# Patient Record
Sex: Male | Born: 1970 | Race: White | Hispanic: No | Marital: Single | Smoking: Current every day smoker
Health system: Southern US, Community
[De-identification: ages and names within clinical notes are randomized; demographics above are authoritative.]

## PROBLEM LIST (undated history)

## (undated) HISTORY — PX: HERNIA REPAIR: SHX51

---

## 1999-09-19 ENCOUNTER — Emergency Department (HOSPITAL_COMMUNITY): Admission: EM | Admit: 1999-09-19 | Discharge: 1999-09-19 | Payer: Self-pay | Admitting: Emergency Medicine

## 1999-12-12 ENCOUNTER — Emergency Department (HOSPITAL_COMMUNITY): Admission: EM | Admit: 1999-12-12 | Discharge: 1999-12-12 | Payer: Self-pay | Admitting: Emergency Medicine

## 2006-05-19 ENCOUNTER — Ambulatory Visit (HOSPITAL_COMMUNITY): Admission: RE | Admit: 2006-05-19 | Discharge: 2006-05-19 | Payer: Self-pay | Admitting: *Deleted

## 2009-08-12 ENCOUNTER — Inpatient Hospital Stay (HOSPITAL_COMMUNITY): Admission: EM | Admit: 2009-08-12 | Discharge: 2009-08-13 | Payer: Self-pay | Admitting: Emergency Medicine

## 2009-08-13 ENCOUNTER — Encounter (INDEPENDENT_AMBULATORY_CARE_PROVIDER_SITE_OTHER): Payer: Self-pay | Admitting: Surgery

## 2010-05-31 LAB — URINALYSIS, ROUTINE W REFLEX MICROSCOPIC
Bilirubin Urine: NEGATIVE
Glucose, UA: NEGATIVE mg/dL
Hgb urine dipstick: NEGATIVE
Ketones, ur: NEGATIVE mg/dL
Nitrite: NEGATIVE
Protein, ur: NEGATIVE mg/dL
Specific Gravity, Urine: 1.005 (ref 1.005–1.030)
Urobilinogen, UA: 0.2 mg/dL (ref 0.0–1.0)
pH: 6.5 (ref 5.0–8.0)

## 2010-05-31 LAB — DIFFERENTIAL
Basophils Absolute: 0 10*3/uL (ref 0.0–0.1)
Basophils Relative: 0 % (ref 0–1)
Eosinophils Absolute: 0.1 10*3/uL (ref 0.0–0.7)
Eosinophils Relative: 1 % (ref 0–5)
Lymphocytes Relative: 11 % — ABNORMAL LOW (ref 12–46)
Lymphs Abs: 1.4 10*3/uL (ref 0.7–4.0)
Monocytes Absolute: 0.5 10*3/uL (ref 0.1–1.0)
Monocytes Relative: 5 % (ref 3–12)
Neutro Abs: 10.2 10*3/uL — ABNORMAL HIGH (ref 1.7–7.7)
Neutrophils Relative %: 83 % — ABNORMAL HIGH (ref 43–77)

## 2010-05-31 LAB — BASIC METABOLIC PANEL
BUN: 5 mg/dL — ABNORMAL LOW (ref 6–23)
CO2: 26 mEq/L (ref 19–32)
Calcium: 8.4 mg/dL (ref 8.4–10.5)
Chloride: 100 mEq/L (ref 96–112)
Creatinine, Ser: 0.97 mg/dL (ref 0.4–1.5)
GFR calc Af Amer: >60 mL/min (ref >60)
GFR calc non Af Amer: >60 mL/min (ref >60)
Glucose, Bld: 110 mg/dL — ABNORMAL HIGH (ref 70–99)
Potassium: 3.8 mEq/L (ref 3.5–5.1)
Sodium: 130 mEq/L — ABNORMAL LOW (ref 135–145)

## 2010-05-31 LAB — CBC
HCT: 44.9 % (ref 39.0–52.0)
Hemoglobin: 15.8 g/dL (ref 13.0–17.0)
MCHC: 35.1 g/dL (ref 30.0–36.0)
MCV: 91.2 fL (ref 78.0–100.0)
Platelets: 200 10*3/uL (ref 150–400)
RBC: 4.93 MIL/uL (ref 4.22–5.81)
RDW: 13 % (ref 11.5–15.5)
WBC: 12.3 10*3/uL — ABNORMAL HIGH (ref 4.0–10.5)

## 2010-07-30 NOTE — Op Note (Signed)
John Pearson, John Pearson             ACCOUNT NO.:  [id number]   MEDICAL RECORD NO.:  [id number]          PATIENT TYPE:  AMB   LOCATION:  DAY                          FACILITY:  [facility]   PHYSICIAN:  Alfonse Ras, MD   DATE OF BIRTH:  February 08, 1971   DATE OF PROCEDURE:  05/19/2006  DATE OF DISCHARGE:                               OPERATIVE REPORT   PREOPERATIVE DIAGNOSIS:  Bilateral inguinal hernias.   POSTOPERATIVE DIAGNOSIS:  Bilateral inguinal hernias, indirect.   PROCEDURE:  Bilateral inguinal hernia repair left side with mesh.   SURGEON:  Dr. Baruch Merl.   ANESTHESIA:  General.   DESCRIPTION OF PROCEDURE:  The patient was taken to the operating room,  placed in a supine position and after adequate general anesthesia was  induced using endotracheal tube the abdomen and groins were prepped and  draped in the normal sterile fashion. Using an oblique incision over the  right inguinal region, I dissected down the external oblique fascia  which was opened along its fibers.  This was taken down to the external  ring.  The spermatic cord was surrounded with a Penrose drain at the  external ring.  Indirect hernia sac was identified and dissected off the  spermatic cord.  This was ligated high up at the internal ring.  A  couple of reinforcing sutures were then approximated between the  inguinal ligament and transversalis fascia up near the internal ring.  There was no evidence of direct inguinal hernia.  The external oblique  fascia was then closed with a running 3-0 Vicryl and skin was closed  with staples.   I then turned my attention to the left side. An oblique incision was  made there. Again using Bovie electrocautery, I dissected down onto the  external oblique fascia.  This was opened along its fibers down to the  external ring.  Again the spermatic cord was identified and surrounded  with the Penrose drain.  Again an indirect hernia sac was dissected off  the spermatic cord  and ligated at the internal ring.  There was a little  bit more of a weakness in the floor of Hesselbach's triangle on this  side and therefore I approximated the floor using interrupted #0  Surgilon sutures and then reinforced it with Ethicon made mesh.  This  was reinforced using a 2-0 Prolene suture, split and brought out lateral  to the internal ring. The external oblique fascia was closed with a  running 3-0 Vicryl suture.  The skin was closed with staples.  Sterile  sterile dressings were applied.  The patient was taken to the recovery  room in good condition.      Alfonse Ras, MD  Electronically Signed     KRE/MEDQ  D:  05/19/2006  T:  05/19/2006  Job:  [id number]

## 2010-10-08 IMAGING — CT CT ABD-PELV W/ CM
2 of 4 series · 17 of 46 positions shown, 19 images · IV contrast (APPLIED)
Comparison: None.

Addendum Begins

This addendum is given for the purpose of correcting the first
conclusion in the initially dictated report.  Reference to the
Littre hernia is incorrect.  The right name for a hernia containing
the appendix is an Amyand's hernia.
Addendum Ends
CLINICAL DATA: Right groin pain.
CT ABDOMEN AND PELVIS WITH CONTRAST
TECHNIQUE: Multidetector CT imaging of the abdomen and pelvis was
performed following the standard protocol during bolus
administration of intravenous contrast.
Contrast: 80 ml 5mnipaque-ZTT.

[Series 2: abd/pelv with 5.0 b31f st · axial · 0.69mm/px · z∈[+605,+1025]mm · 14 of 93 slices shown, 16 images]
[im 5/93  soft-tissue]
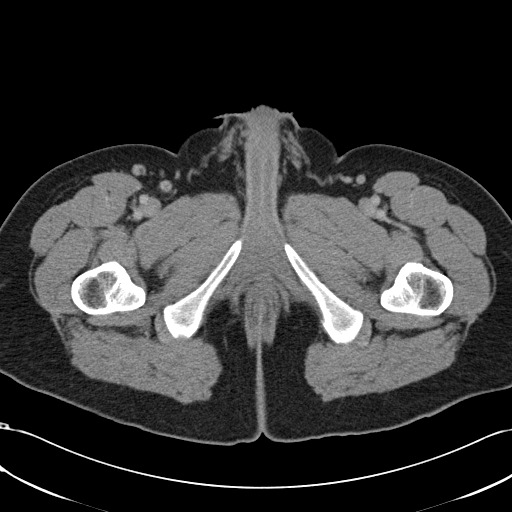
[im 5/93  bone]
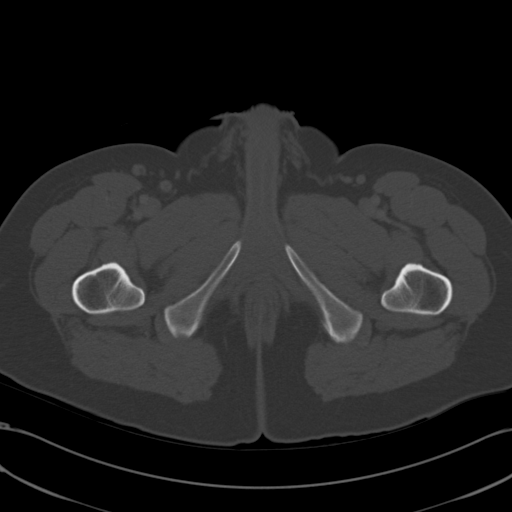
[im 13/93  soft-tissue]
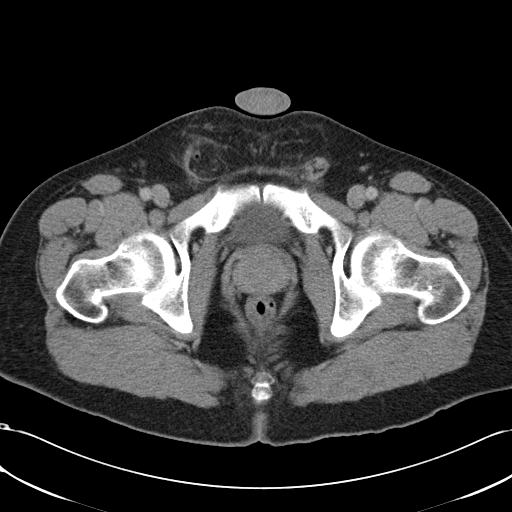
[im 17/93  soft-tissue]
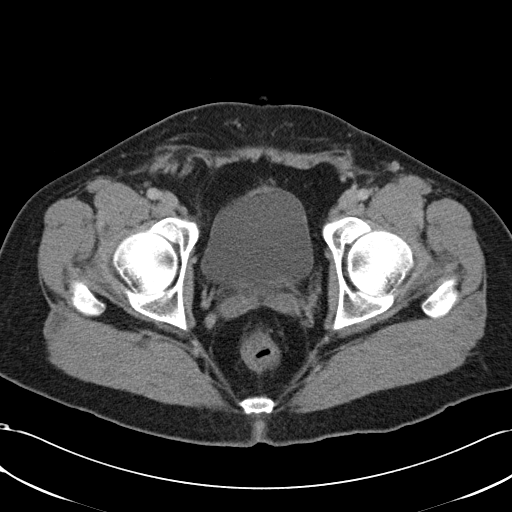
[im 25/93  soft-tissue]
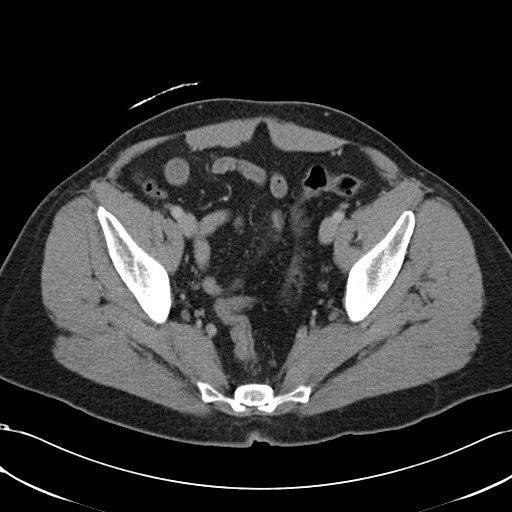
[im 33/93  soft-tissue]
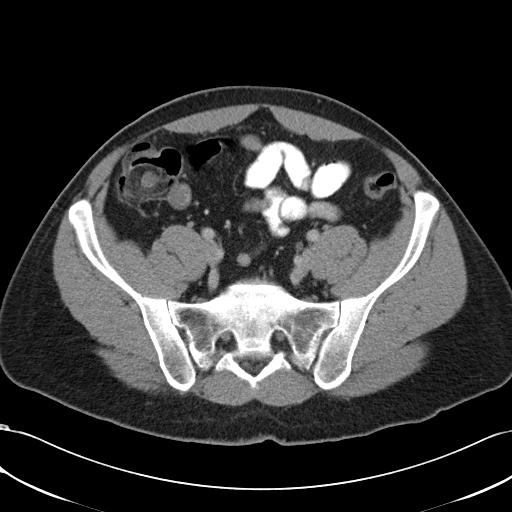
[im 37/93  soft-tissue]
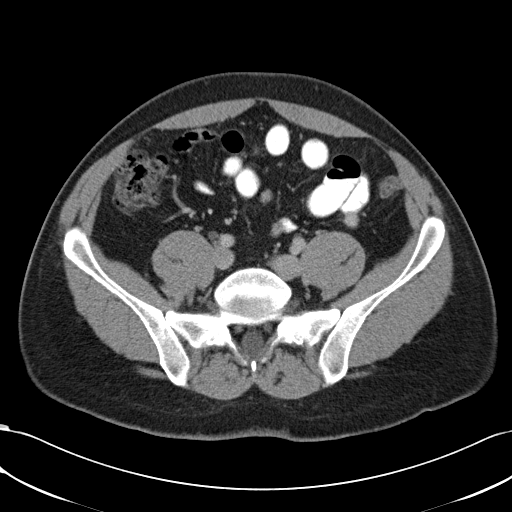
[im 45/93  soft-tissue]
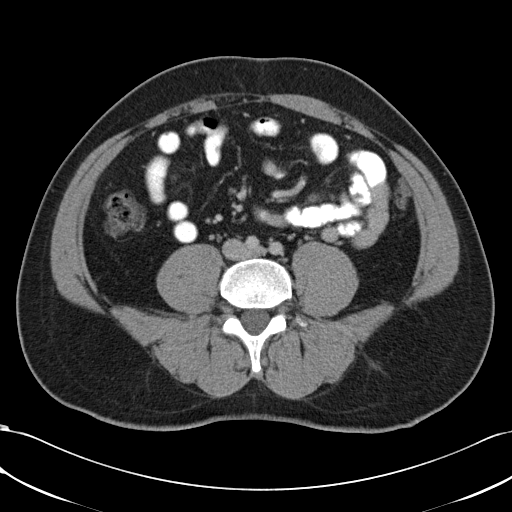
[im 49/93  soft-tissue]
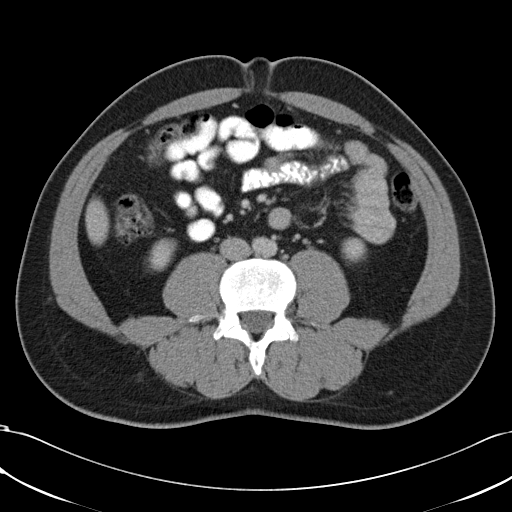
[im 57/93  soft-tissue]
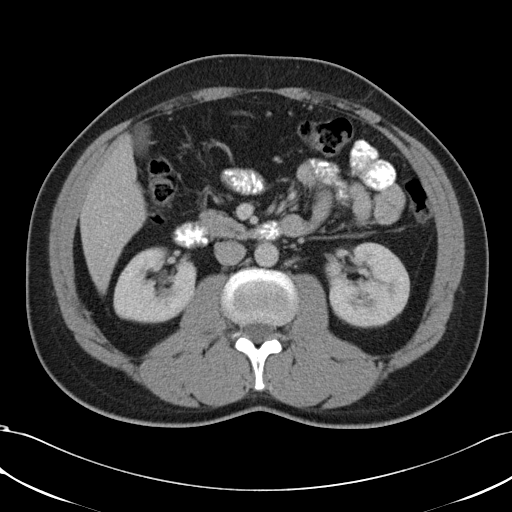
[im 57/93  bone]
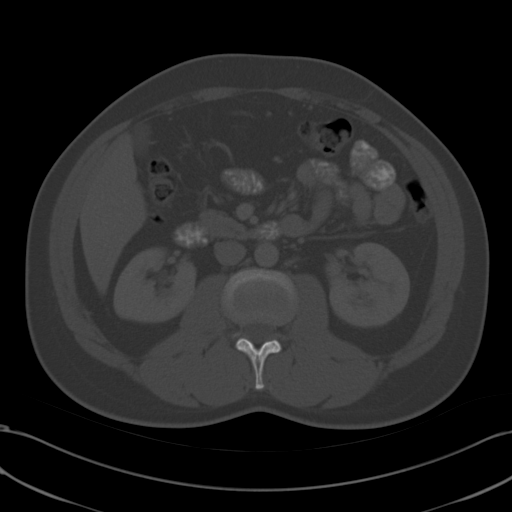
[im 61/93  soft-tissue]
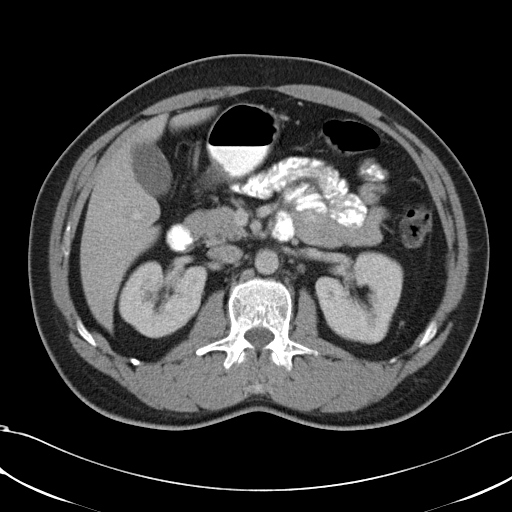
[im 69/93  soft-tissue]
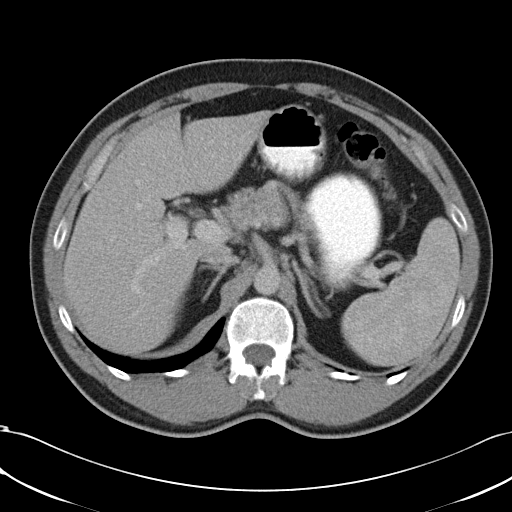
[im 77/93  soft-tissue]
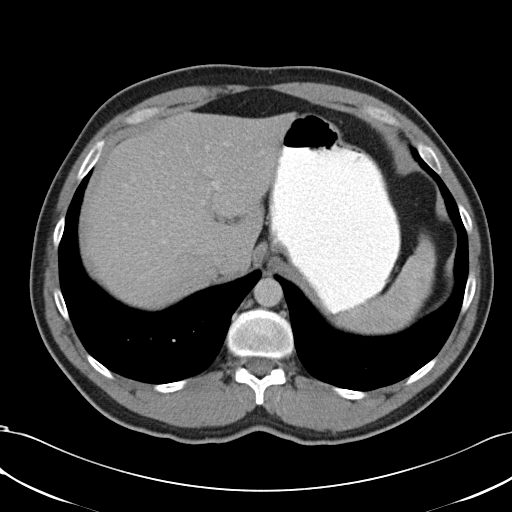
[im 81/93  soft-tissue]
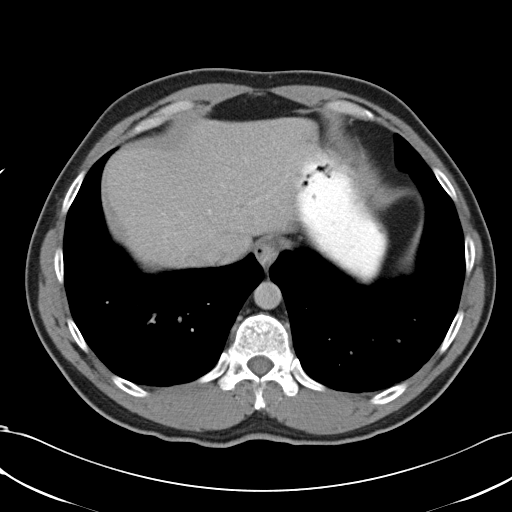
[im 89/93  soft-tissue]
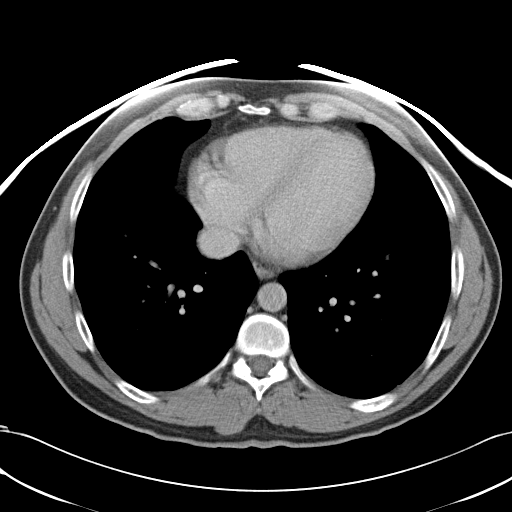

[Series 5: abd/pelv with 2.0 spo st · coronal · 0.91mm/px · 3 of 125 slices shown]
[im 42/125  soft-tissue]
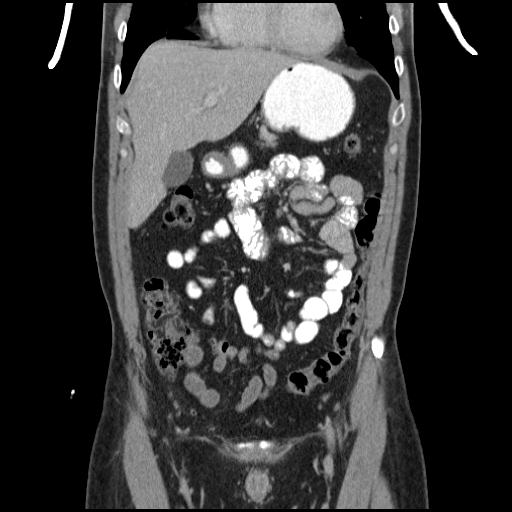
[im 56/125  soft-tissue]
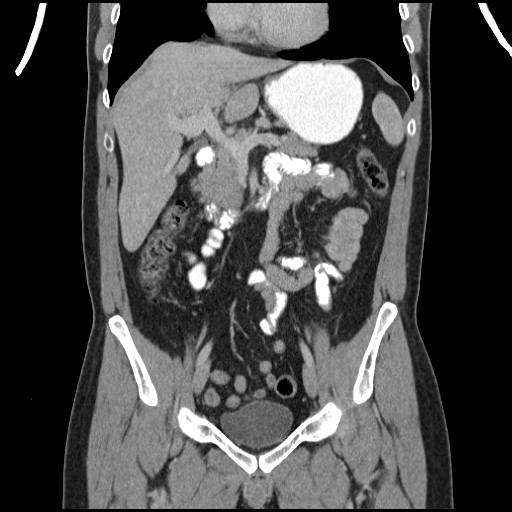
[im 69/125  soft-tissue]
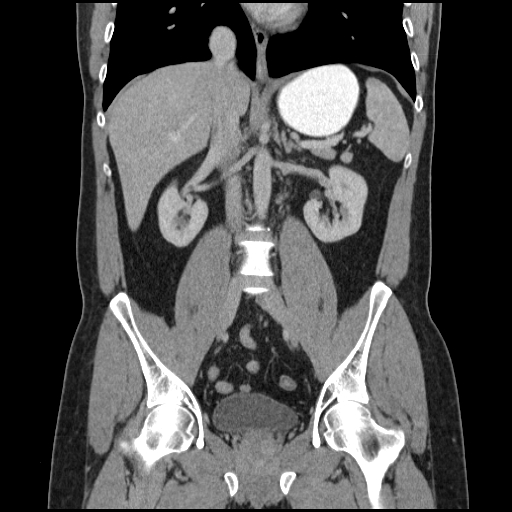

[17 of 46 positions shown; findings below may reference images not displayed]

FINDINGS: Lung bases are clear.  No pleural or pericardial
effusion.

The patient has a right inguinal hernia which contains the
appendix.  There is some infiltration of fat about the appendix but
the appendix itself is not dilated and contains gas distally.

There is a small amount of perihepatic fluid.  A 0.5 cm low
attenuating lesion in the inferior right hepatic lobe is likely a
cyst.  The liver is otherwise unremarkable.  The gallbladder,
adrenal glands, spleen, pancreas and kidneys appear normal.
Stomach and small bowel are unremarkable.  The patient has a fat
containing umbilical hernia.  There is no focal bony abnormality.
IMPRESSION: 1. Right inguinal hernia containing the appendix consistent with a
Littre hernia.  Although the appearance the appendix is not typical
of appendicitis, there is some stranding of fat within the hernia
which may be due to early appendicitis or possibly incarceration of
the appendix within the hernia.
2.  Small amount of perihepatic fluid.
3.  Fat containing umbilical hernia.

## 2012-10-27 ENCOUNTER — Emergency Department (HOSPITAL_COMMUNITY)
Admission: EM | Admit: 2012-10-27 | Discharge: 2012-10-27 | Disposition: A | Discharge status: Home / Self Care | Payer: Self-pay | Attending: Emergency Medicine | Admitting: Emergency Medicine

## 2012-10-27 ENCOUNTER — Encounter (HOSPITAL_COMMUNITY): Payer: Self-pay | Admitting: Emergency Medicine

## 2012-10-27 DIAGNOSIS — R109 Unspecified abdominal pain: Secondary | ICD-10-CM | POA: Insufficient documentation

## 2012-10-27 DIAGNOSIS — Y929 Unspecified place or not applicable: Secondary | ICD-10-CM | POA: Insufficient documentation

## 2012-10-27 DIAGNOSIS — K089 Disorder of teeth and supporting structures, unspecified: Secondary | ICD-10-CM | POA: Insufficient documentation

## 2012-10-27 DIAGNOSIS — R51 Headache: Secondary | ICD-10-CM | POA: Insufficient documentation

## 2012-10-27 DIAGNOSIS — K4090 Unilateral inguinal hernia, without obstruction or gangrene, not specified as recurrent: Secondary | ICD-10-CM | POA: Insufficient documentation

## 2012-10-27 DIAGNOSIS — Y939 Activity, unspecified: Secondary | ICD-10-CM | POA: Insufficient documentation

## 2012-10-27 DIAGNOSIS — G8929 Other chronic pain: Secondary | ICD-10-CM | POA: Insufficient documentation

## 2012-10-27 DIAGNOSIS — Z79899 Other long term (current) drug therapy: Secondary | ICD-10-CM | POA: Insufficient documentation

## 2012-10-27 DIAGNOSIS — X58XXXA Exposure to other specified factors, initial encounter: Secondary | ICD-10-CM | POA: Insufficient documentation

## 2012-10-27 DIAGNOSIS — F17200 Nicotine dependence, unspecified, uncomplicated: Secondary | ICD-10-CM | POA: Insufficient documentation

## 2012-10-27 DIAGNOSIS — K029 Dental caries, unspecified: Secondary | ICD-10-CM | POA: Insufficient documentation

## 2012-10-27 DIAGNOSIS — K0889 Other specified disorders of teeth and supporting structures: Secondary | ICD-10-CM

## 2012-10-27 MED ORDER — PENICILLIN V POTASSIUM 500 MG PO TABS: 500.0000 mg | ORAL_TABLET | Freq: Four times a day (QID) | ORAL | Status: AC

## 2012-10-27 MED ORDER — HYDROCODONE-ACETAMINOPHEN 5-325 MG PO TABS
2.0000 | ORAL_TABLET | Freq: Once | ORAL | Status: AC
  Administered 2012-10-27: 2 via ORAL
  Filled 2012-10-27: qty 2

## 2012-10-27 MED ORDER — PROMETHAZINE HCL 25 MG PO TABS: 25.0000 mg | ORAL_TABLET | Freq: Four times a day (QID) | ORAL | Status: DC | PRN

## 2012-10-27 MED ORDER — HYDROCODONE-ACETAMINOPHEN 5-325 MG PO TABS: 1.0000 | ORAL_TABLET | ORAL | Status: DC | PRN

## 2012-10-27 NOTE — ED Provider Notes (Signed)
CSN: [id number]     Arrival date & time 10/27/12  0145 History     First MD Initiated Contact with Patient 10/27/12 [phone]     Chief Complaint  Patient presents with  . Facial Pain  . Dental Problem  . Groin Injury   HPI  History provided by the patient. The patient is a 42 year old male with history of bilateral inguinal hernia repairs who presents with 2 complaints of continued dental pains as well as chronic pain to his right groin and inguinal area. Patient admits to very poor dentition. He occasionally has dental pains and aches, however over the past several days he complains of worsened pain or around the left upper molar areas. Patient has been taking ibuprofen and Orajel which has helped some with symptoms. He feels there is some increased swelling around his cheek and face area. The pain radiates into the head and ear area. He denies any difficulty swallowing or breathing. Denies any fever, chills or sweats. Patient also complains that he has chronic pains with right inguinal area with bulging hernia. He reports having this repaired twice in the past. He has had daily discomfort for the past year or more to this area. He states when he stands and does any lifting there is a large bulge to his groin area. He denies any constant or severe pains. No diarrhea or constipation symptoms. No other aggravating or alleviating factors. No other associated symptoms.    History reviewed. No pertinent past medical history. Past Surgical History  Procedure Laterality Date  . Hernia repair     No family history on file. History  Substance Use Topics  . Smoking status: Current Every Day Smoker  . Smokeless tobacco: Not on file  . Alcohol Use: No    Review of Systems  Constitutional: Negative for fever, chills and diaphoresis.  HENT: Positive for dental problem. Negative for sore throat and trouble swallowing.   Gastrointestinal: Negative for nausea, vomiting, diarrhea and constipation.  All  other systems reviewed and are negative.    Allergies  Review of patient's allergies indicates no known allergies.  Home Medications   Current Outpatient Rx  Name  Route  Sig  Dispense  Refill  . acetaminophen (TYLENOL) 500 MG tablet   Oral   Take 500 mg by mouth every 6 (six) hours as needed for pain.         Marland Kitchen HYDROcodone-acetaminophen (NORCO) 5-325 MG per tablet   Oral   Take 1 tablet by mouth every 4 (four) hours as needed for pain.   20 tablet   0   . penicillin v potassium (VEETID) 500 MG tablet   Oral   Take 1 tablet (500 mg total) by mouth 4 (four) times daily.   40 tablet   0   . promethazine (PHENERGAN) 25 MG tablet   Oral   Take 1 tablet (25 mg total) by mouth every 6 (six) hours as needed for nausea.   20 tablet   0    BP 147/86  Pulse 47  Temp(Src) 98.8 F (37.1 C) (Oral)  Resp 16  Ht 5\' 10"  (1.778 m)  Wt 189 lb (85.73 kg)  BMI 27.12 kg/m2  SpO2 97% Physical Exam  Nursing note and vitals reviewed. Constitutional: He is oriented to person, place, and time. He appears well-developed and well-nourished.  HENT:  Head: Normocephalic.  Significant dental decay throughout. Multiple teeth eroded to the gumline. There is some swelling around the remaining teeth of the  left upper molar area. No bleeding from the gums. No fluctuance or signs of drainable abscess. No swelling of the tongue.  Cardiovascular: Normal rate and regular rhythm.   Pulmonary/Chest: Effort normal and breath sounds normal.  Abdominal: Soft. There is no tenderness. There is no rebound and no guarding.  Genitourinary:  Moderate size right inguinal hernia is reducible. There is mild tenderness.  Musculoskeletal: Normal range of motion.  Neurological: He is alert and oriented to person, place, and time.  Skin: Skin is warm.  Psychiatric: He has a normal mood and affect. His behavior is normal.    ED Course   Procedures   1. Pain, dental   2. Inguinal hernia     MDM  5:45AM  patient seen and evaluated. Patient appears in some discomfort but no acute distress.  Patient has a reducible right inguinal hernia. No concerning signs of incarceration at this time. Mild tenderness to palpation. Will encourage him to followup with Gen. surgery. Patient also given dental referral for his dental pains.  Angus Seller, PA-C 10/27/12 2357

## 2012-10-27 NOTE — ED Notes (Signed)
Pt c/o facial pain, pt states he has lost several teeth lately. Pt also c/o R groin pain from hernia, pt states he has had surgery x 2 on same hernia

## 2012-10-28 NOTE — ED Provider Notes (Signed)
Medical screening examination/treatment/procedure(s) were performed by non-physician practitioner and as supervising physician I was immediately available for consultation/collaboration.  Katisha Shimizu M Rilea Arutyunyan, MD 10/28/12 0607 

## 2013-02-21 ENCOUNTER — Emergency Department (HOSPITAL_COMMUNITY): Payer: Self-pay

## 2013-02-21 ENCOUNTER — Encounter (HOSPITAL_COMMUNITY): Payer: Self-pay | Admitting: Emergency Medicine

## 2013-02-21 ENCOUNTER — Emergency Department (HOSPITAL_COMMUNITY)
Admission: EM | Admit: 2013-02-21 | Discharge: 2013-02-21 | Disposition: A | Discharge status: Home / Self Care | Payer: Self-pay | Attending: Emergency Medicine | Admitting: Emergency Medicine

## 2013-02-21 DIAGNOSIS — F17200 Nicotine dependence, unspecified, uncomplicated: Secondary | ICD-10-CM | POA: Insufficient documentation

## 2013-02-21 DIAGNOSIS — R141 Gas pain: Secondary | ICD-10-CM | POA: Insufficient documentation

## 2013-02-21 DIAGNOSIS — R142 Eructation: Secondary | ICD-10-CM | POA: Insufficient documentation

## 2013-02-21 DIAGNOSIS — K5900 Constipation, unspecified: Secondary | ICD-10-CM | POA: Insufficient documentation

## 2013-02-21 LAB — CBC WITH DIFFERENTIAL/PLATELET
Basophils Relative: 0 % (ref 0–1)
Lymphocytes Relative: 23 % (ref 12–46)
Lymphs Abs: 2.7 10*3/uL (ref 0.7–4.0)
MCH: 31.8 pg (ref 26.0–34.0)
Monocytes Relative: 6 % (ref 3–12)
RBC: 5.32 MIL/uL (ref 4.22–5.81)
WBC: 11.9 10*3/uL — ABNORMAL HIGH (ref 4.0–10.5)

## 2013-02-21 LAB — URINALYSIS, ROUTINE W REFLEX MICROSCOPIC
Bilirubin Urine: NEGATIVE
Glucose, UA: NEGATIVE mg/dL
Leukocytes, UA: NEGATIVE
Protein, ur: NEGATIVE mg/dL

## 2013-02-21 LAB — POCT I-STAT, CHEM 8
Chloride: 106 mEq/L (ref 96–112)
Glucose, Bld: 124 mg/dL — ABNORMAL HIGH (ref 70–99)
HCT: 50 % (ref 39.0–52.0)
Hemoglobin: 17 g/dL (ref 13.0–17.0)
Potassium: 3.7 mEq/L (ref 3.5–5.1)
Sodium: 139 mEq/L (ref 135–145)
TCO2: 26 mmol/L (ref 0–100)

## 2013-02-21 MED ORDER — SENNOSIDES-DOCUSATE SODIUM 8.6-50 MG PO TABS: 1.0000 | ORAL_TABLET | Freq: Two times a day (BID) | ORAL | Status: DC

## 2013-02-21 MED ORDER — KETOROLAC TROMETHAMINE 60 MG/2ML IM SOLN
60.0000 mg | Freq: Once | INTRAMUSCULAR | Status: AC
  Administered 2013-02-21: 60 mg via INTRAMUSCULAR
  Filled 2013-02-21: qty 2

## 2013-02-21 MED ORDER — MELOXICAM 7.5 MG PO TABS: 7.5000 mg | ORAL_TABLET | Freq: Every day | ORAL | Status: DC

## 2013-02-21 MED ORDER — DOCUSATE SODIUM 100 MG PO CAPS: 100.0000 mg | ORAL_CAPSULE | Freq: Two times a day (BID) | ORAL | Status: DC

## 2013-02-21 MED ORDER — POLYETHYLENE GLYCOL 3350 17 GM/SCOOP PO POWD: 1.0000 | Freq: Once | ORAL | Status: DC

## 2013-02-21 NOTE — ED Provider Notes (Signed)
CSN: [id number]     Arrival date & time 02/21/13  0246 History   First MD Initiated Contact with Patient 02/21/13 [phone]     Chief Complaint  Patient presents with  . Abdominal Pain   (Consider location/radiation/quality/duration/timing/severity/associated sxs/prior Treatment) Patient is a 43 y.o. male presenting with abdominal pain. The history is provided by the patient.  Abdominal Pain Pain location: right inguinal                                                                                                                                                                                                        Pain quality: burning   Pain radiation: Hand foot etc. Pain severity:  Moderate Onset quality:  Gradual Timing:  Constant Progression:  Unchanged Chronicity:  Recurrent Context comment:  Straining to have bowel movements Relieved by:  Nothing Worsened by:  Bowel movements Ineffective treatments:  None tried Associated symptoms: no dysuria, no nausea and no vomiting   Risk factors: multiple surgeries     History reviewed. No pertinent past medical history. Past Surgical History  Procedure Laterality Date  . Hernia repair     No family history on file. History  Substance Use Topics  . Smoking status: Current Every Day Smoker  . Smokeless tobacco: Not on file  . Alcohol Use: No    Review of Systems  Gastrointestinal: Positive for abdominal pain. Negative for nausea and vomiting.  Genitourinary: Negative for dysuria.  All other systems reviewed and are negative.    Allergies  Review of patient's allergies indicates no known allergies.  Home Medications   Current Outpatient Rx  Name  Route  Sig  Dispense  Refill  . acetaminophen (TYLENOL) 500 MG tablet   Oral   Take 500 mg by mouth every 6 (six) hours as needed for pain.         Marland Kitchen HYDROcodone-acetaminophen (NORCO) 5-325 MG per tablet   Oral   Take 1 tablet by mouth every 4 (four) hours as needed for  pain.   20 tablet   0   . promethazine (PHENERGAN) 25 MG tablet   Oral   Take 1 tablet (25 mg total) by mouth every 6 (six) hours as needed for nausea.   20 tablet   0    BP 103/68  Pulse 64  Temp(Src) 98.3 F (36.8 C)  Resp 18  Ht 5\' 10"  (1.778 m)  Wt 185 lb (83.915 kg)  BMI 26.54 kg/m2  SpO2 97% Physical Exam  Constitutional: He is oriented to person, place, and time. He appears  well-developed and well-nourished. No distress.  HENT:  Head: Normocephalic and atraumatic.  Mouth/Throat: Oropharynx is clear and moist.  Eyes: Conjunctivae are normal. Pupils are equal, round, and reactive to light.  Neck: Normal range of motion. Neck supple.  Cardiovascular: Normal rate, regular rhythm and intact distal pulses.   Pulmonary/Chest: Effort normal and breath sounds normal. He has no wheezes. He has no rales.  Abdominal: Soft. He exhibits distension. Bowel sounds are increased. There is no tenderness. There is no rebound and no guarding.  Musculoskeletal: Normal range of motion. He exhibits no edema.  Neurological: He is alert and oriented to person, place, and time.  Skin: Skin is warm and dry.  Psychiatric: He has a normal mood and affect.    ED Course  Procedures (including critical care time) Labs Review Labs Reviewed  URINALYSIS, ROUTINE W REFLEX MICROSCOPIC - Abnormal; Notable for the following:    APPearance CLOUDY (*)    All other components within normal limits  CBC WITH DIFFERENTIAL - Abnormal; Notable for the following:    WBC 11.9 (*)    Neutro Abs 8.3 (*)    All other components within normal limits  POCT I-STAT, CHEM 8 - Abnormal; Notable for the following:    Glucose, Bld 124 (*)    All other components within normal limits   Imaging Review Dg Abd Acute W/chest  02/21/2013   CLINICAL DATA:  Abdominal pain and constipation.  EXAM: ACUTE ABDOMEN SERIES (ABDOMEN 2 VIEW & CHEST 1 VIEW)  COMPARISON:  CT of the abdomen and pelvis performed 08/12/2009  FINDINGS:  The lungs are well-aerated and clear. There is no evidence of focal opacification, pleural effusion or pneumothorax. The cardiomediastinal silhouette is within normal limits.  The visualized bowel gas pattern is unremarkable. Scattered fluid and air are seen within the colon; there is no evidence of small bowel dilatation to suggest obstruction. No free intra-abdominal air is identified on the provided upright view.  No acute osseous abnormalities are seen; the sacroiliac joints are unremarkable in appearance.  IMPRESSION: 1. Unremarkable bowel gas pattern; no free intra-abdominal air seen. 2. No acute cardiopulmonary process identified.   Electronically Signed   By: Roanna Raider M.D.   On: 02/21/2013 07:13    EKG Interpretation   None       MDM  No diagnosis found. Constipation, no hernia on exam.  No signs of obstruction no fevers no vomiting same for discharge with pain meds    Axel Meas K Baer Hinton-Rasch, MD 02/21/13 [phone]

## 2013-02-21 NOTE — ED Notes (Addendum)
Pt to ED for evaluation of RLQ pain onset tonight- pain has gotten worse since onset- admits to radiation of pain to groin area.  Pt admits to hx of hernia in the past.  Denies N/V/D.

## 2014-04-19 IMAGING — CR DG ABDOMEN ACUTE W/ 1V CHEST
3 series · 3 of 3 positions shown · non-contrast
Comparison: CT of the abdomen and pelvis performed 08/12/2009

CLINICAL DATA: Abdominal pain and constipation.

EXAM:
ACUTE ABDOMEN SERIES (ABDOMEN 2 VIEW & CHEST 1 VIEW)

[w chest pa]
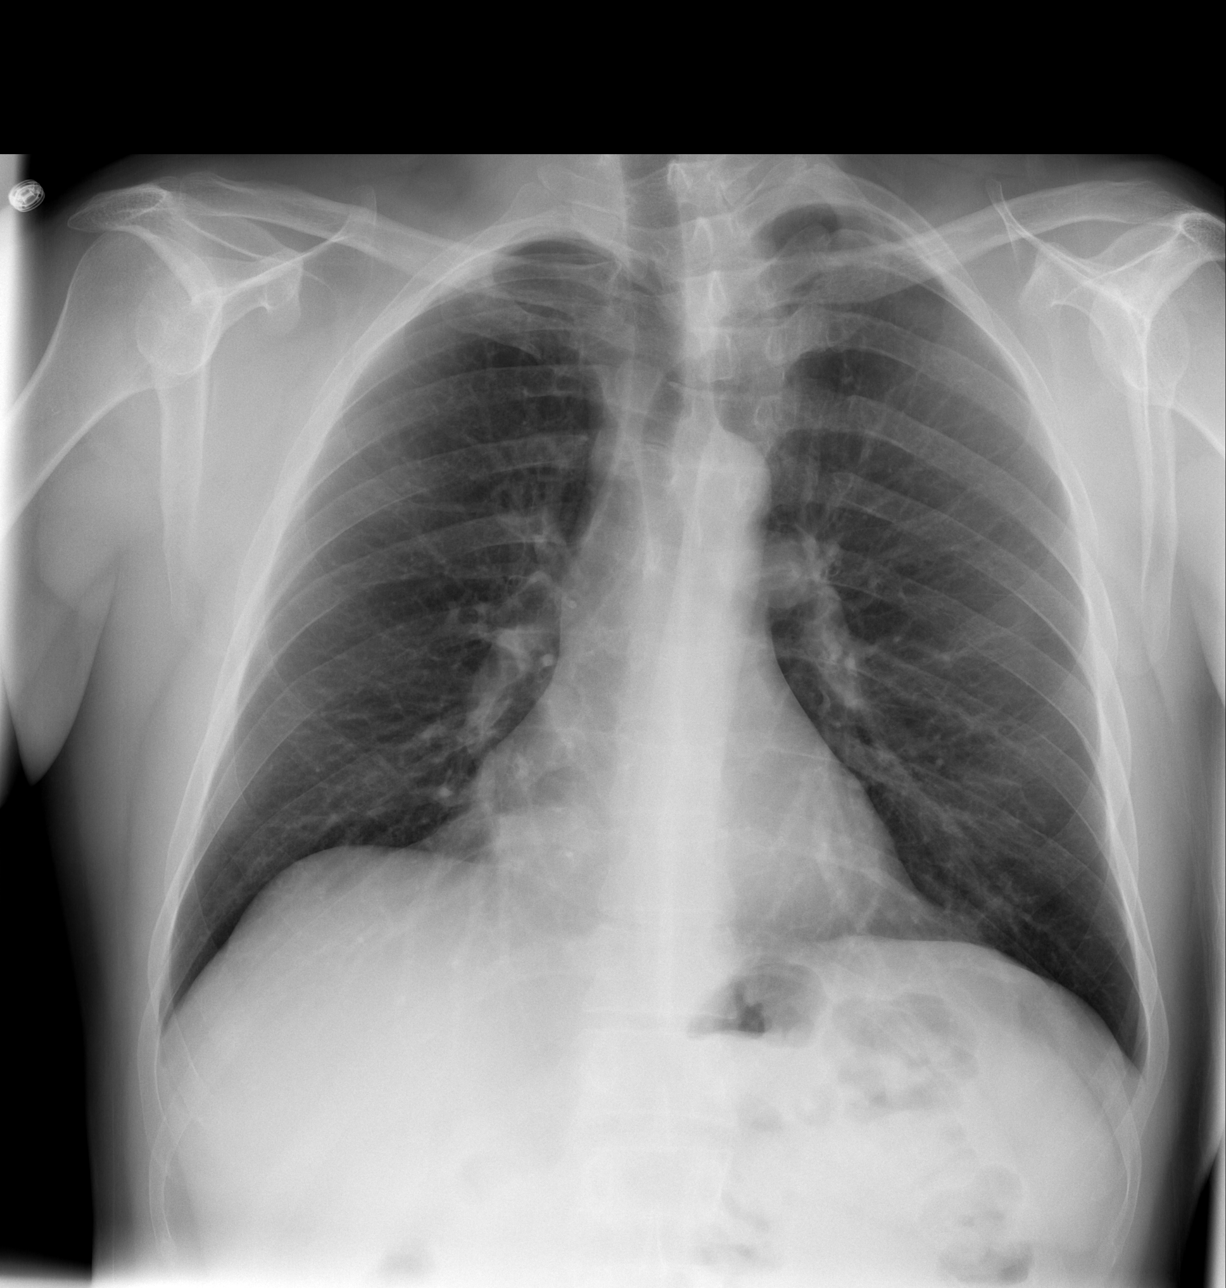

[w abdomen upright]
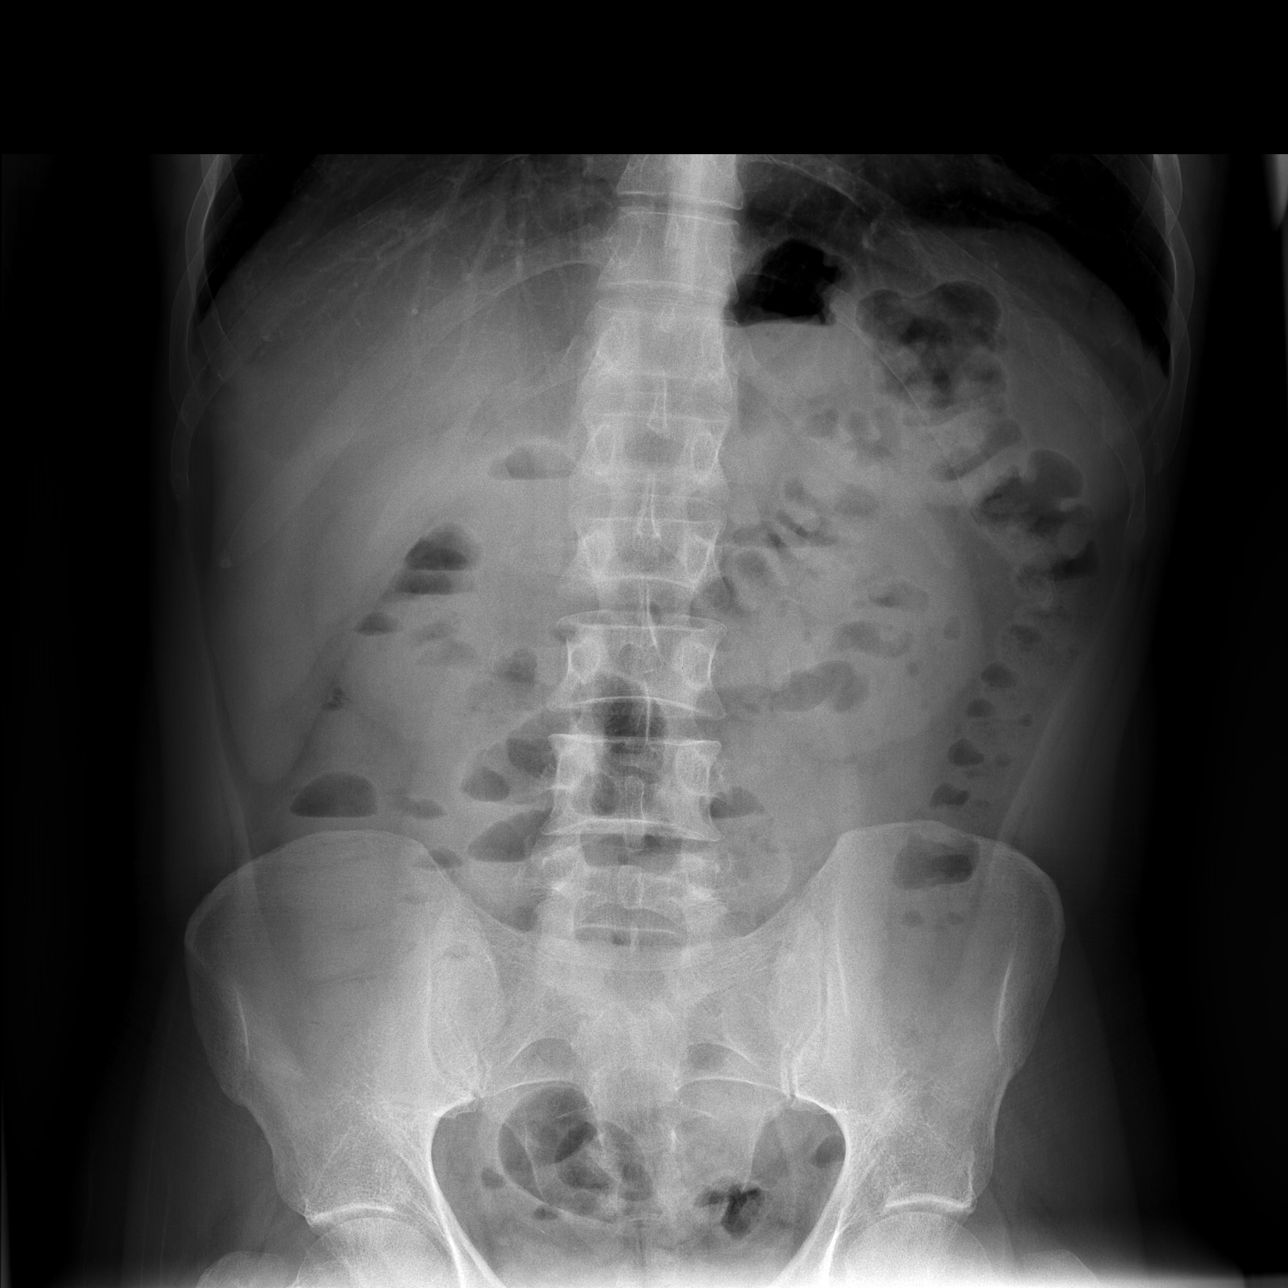

[t abdomen supine]
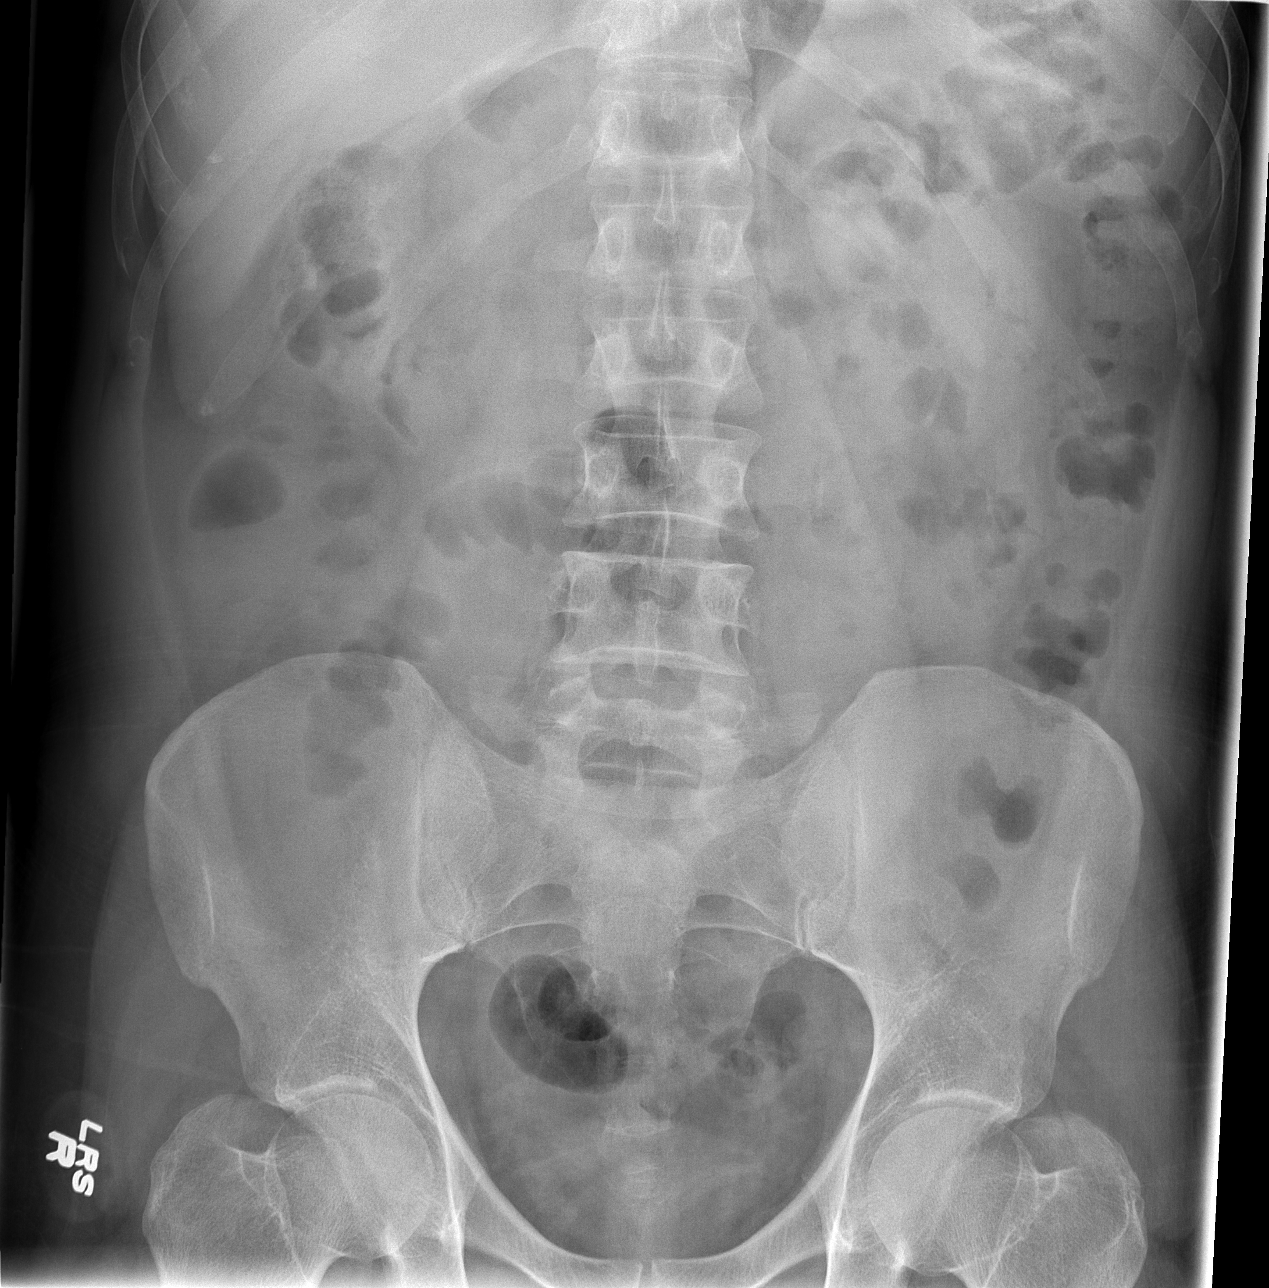

[3 of 3 positions shown; findings below may reference images not displayed]

FINDINGS: The lungs are well-aerated and clear. There is no evidence of focal
opacification, pleural effusion or pneumothorax. The
cardiomediastinal silhouette is within normal limits.

The visualized bowel gas pattern is unremarkable. Scattered fluid
and air are seen within the colon; there is no evidence of small
bowel dilatation to suggest obstruction. No free intra-abdominal air
is identified on the provided upright view.

No acute osseous abnormalities are seen; the sacroiliac joints are
unremarkable in appearance.
IMPRESSION: 1. Unremarkable bowel gas pattern; no free intra-abdominal air seen.
2. No acute cardiopulmonary process identified.

## 2016-11-22 ENCOUNTER — Ambulatory Visit (INDEPENDENT_AMBULATORY_CARE_PROVIDER_SITE_OTHER): Payer: Self-pay | Admitting: Orthopaedic Surgery

## 2017-01-02 ENCOUNTER — Encounter (INDEPENDENT_AMBULATORY_CARE_PROVIDER_SITE_OTHER): Payer: Self-pay | Admitting: Orthopaedic Surgery

## 2017-01-02 ENCOUNTER — Ambulatory Visit (INDEPENDENT_AMBULATORY_CARE_PROVIDER_SITE_OTHER): Payer: Self-pay | Admitting: Orthopaedic Surgery

## 2017-01-02 DIAGNOSIS — M79641 Pain in right hand: Secondary | ICD-10-CM

## 2017-01-02 DIAGNOSIS — M79642 Pain in left hand: Secondary | ICD-10-CM

## 2017-01-02 NOTE — Progress Notes (Signed)
   Office Visit Note   Patient: John Pearson           Date of Birth: 04/09/1970           MRN: [id number] Visit Date: 01/02/2017              Requested by: No referring provider defined for this encounter. PCP: Patient, No Pcp Per   Assessment & Plan: Visit Diagnoses:  1. Pain in both hands     Plan: Patient presents with atypical pattern of hand complaints.  Recommend nerve conduction studies to rule out carpal tunnel syndrome.  He does endorse some rhythmic movements which may be related to a neurologic dysfunction.  If his EMGs are normal then we will neurologist to evaluate for some sort of movement disorder.  Follow-Up Instructions: Return if symptoms worsen or fail to improve.   Orders:  Orders Placed This Encounter  Procedures  . Ambulatory referral to Physical Medicine Rehab   No orders of the defined types were placed in this encounter.     Procedures: No procedures performed   Clinical Data: No additional findings.   Subjective: Chief Complaint  Patient presents with  . Right Hand - Pain  . Left Hand - Pain    Patient is a 46 year old gentleman with bilateral hand pain and shaking for several years.  He plays a guitar and this is significantly bothersome for his ability to do so.  He ambulates with a cane.  He denies any injuries.  Denies any numbness and tingling.    Review of Systems  Constitutional: Negative.   All other systems reviewed and are negative.    Objective: Vital Signs: There were no vitals taken for this visit.  Physical Exam  Constitutional: He is oriented to person, place, and time. He appears well-developed and well-nourished.  HENT:  Head: Normocephalic and atraumatic.  Eyes: Pupils are equal, round, and reactive to light.  Neck: Neck supple.  Pulmonary/Chest: Effort normal.  Abdominal: Soft.  Musculoskeletal: Normal range of motion.  Neurological: He is alert and oriented to person, place, and time.  Skin: Skin is  warm.  Psychiatric: He has a normal mood and affect. His behavior is normal. Judgment and thought content normal.  Nursing note and vitals reviewed.   Ortho Exam Bilateral hand exam shows no masses lesions or ulcers.  He has full motor and sensory function.  Negative carpal tunnel compressive signs. Specialty Comments:  No specialty comments available.  Imaging: No results found.   PMFS History: There are no active problems to display for this patient.  No past medical history on file.  No family history on file.  Past Surgical History:  Procedure Laterality Date  . HERNIA REPAIR     Social History   Occupational History  . Not on file.   Social History Main Topics  . Smoking status: Current Every Day Smoker  . Smokeless tobacco: Never Used  . Alcohol use No  . Drug use: No  . Sexual activity: Not on file

## 2017-01-03 ENCOUNTER — Telehealth (INDEPENDENT_AMBULATORY_CARE_PROVIDER_SITE_OTHER): Payer: Self-pay | Admitting: Orthopaedic Surgery

## 2017-01-03 NOTE — Telephone Encounter (Signed)
01/02/2017 OV NOTE FAXED TO [facility] [phone]

## 2017-01-13 ENCOUNTER — Encounter (INDEPENDENT_AMBULATORY_CARE_PROVIDER_SITE_OTHER): Payer: Self-pay | Admitting: Physical Medicine and Rehabilitation

## 2017-01-13 ENCOUNTER — Ambulatory Visit (INDEPENDENT_AMBULATORY_CARE_PROVIDER_SITE_OTHER): Payer: Self-pay | Admitting: Physical Medicine and Rehabilitation

## 2017-01-13 DIAGNOSIS — R251 Tremor, unspecified: Secondary | ICD-10-CM

## 2017-01-13 DIAGNOSIS — R202 Paresthesia of skin: Secondary | ICD-10-CM

## 2017-01-13 NOTE — Progress Notes (Deleted)
"  Random shaking" in both hands, especially when picking up heavy objects or when writing. No numbness or tingling. Right hand dominant.

## 2017-01-17 ENCOUNTER — Ambulatory Visit (INDEPENDENT_AMBULATORY_CARE_PROVIDER_SITE_OTHER): Payer: Self-pay | Admitting: Orthopaedic Surgery

## 2017-01-17 ENCOUNTER — Encounter (INDEPENDENT_AMBULATORY_CARE_PROVIDER_SITE_OTHER): Payer: Self-pay | Admitting: Orthopaedic Surgery

## 2017-01-17 DIAGNOSIS — M79642 Pain in left hand: Secondary | ICD-10-CM

## 2017-01-17 DIAGNOSIS — M79641 Pain in right hand: Secondary | ICD-10-CM

## 2017-01-17 NOTE — Progress Notes (Signed)
   Office Visit Note   Patient: John Pearson           Date of Birth: 06/01/1970           MRN: [id number] Visit Date: 01/17/2017              Requested by: No referring provider defined for this encounter. PCP: Patient, No Pcp Per   Assessment & Plan: Visit Diagnoses:  1. Bilateral hand pain     Plan: Nerve conduction studies were negative for carpal tunnel syndrome.  We will make a referral to give her [facility] for rhythmic movements of his hands.  Follow-up as needed.  Follow-Up Instructions: Return if symptoms worsen or fail to improve.   Orders:  Orders Placed This Encounter  Procedures  . Ambulatory referral to Neurology   No orders of the defined types were placed in this encounter.     Procedures: No procedures performed   Clinical Data: No additional findings.   Subjective: Chief Complaint  Patient presents with  . Left Hand - Pain  . Right Hand - Pain    Patient follows up today for follow-up of nerve conduction studies which were normal.    Review of Systems   Objective: Vital Signs: There were no vitals taken for this visit.  Physical Exam  Ortho Exam Bilateral hand exam is stable.  Continues to have rhythmic movements. Specialty Comments:  No specialty comments available.  Imaging: No results found.   PMFS History: There are no active problems to display for this patient.  History reviewed. No pertinent past medical history.  History reviewed. No pertinent family history.  Past Surgical History:  Procedure Laterality Date  . HERNIA REPAIR     Social History   Occupational History  . Not on file  Tobacco Use  . Smoking status: Current Every Day Smoker  . Smokeless tobacco: Never Used  Substance and Sexual Activity  . Alcohol use: No  . Drug use: No  . Sexual activity: Not on file

## 2017-01-17 NOTE — Procedures (Signed)
EMG & NCV Findings: All nerve conduction studies (as indicated in the following tables) were within normal limits.  All left vs. right side differences were within normal limits.    All examined muscles (as indicated in the following table) showed no evidence of electrical instability.    Impression: Essentially NORMAL electrodiagnostic study of both upper limbs.  There is no significant electrodiagnostic evidence of nerve entrapment, brachial plexopathy, cervical radiculopathy or generalized peripheral neuropathy.    As you know, purely sensory or demyelinating radiculopathies and chemical radiculitis may not be detected with this particular electrodiagnostic study.  Recommendations: 1.  Follow-up with referring physician. 2.  Continue current management of symptoms.  Consider neurology referral for movement disorder.  Recommend Dr. Arbutus Leasat at [facility].   Nerve Conduction Studies Anti Sensory Summary Table   Stim Site NR Peak (ms) Norm Peak (ms) P-T Amp (V) Norm P-T Amp Site1 Site2 Delta-P (ms) Dist (cm) Vel (m/s) Norm Vel (m/s)  Left Median Acr Palm Anti Sensory (2nd Digit)  32.1C  Wrist    3.0 <3.6 27.7 >10 Wrist Palm 1.4 0.0    Palm    1.6 <2.0 45.7         Right Median Acr Palm Anti Sensory (2nd Digit)  31.3C  Wrist    2.9 <3.6 32.4 >10 Wrist Palm 1.4 0.0    Palm    1.5 <2.0 31.5         Left Radial Anti Sensory (Base 1st Digit)  32C  Wrist    2.1 <3.1 21.8  Wrist Base 1st Digit 2.1 0.0    Right Radial Anti Sensory (Base 1st Digit)  32C  Wrist    2.0 <3.1 20.4  Wrist Base 1st Digit 2.0 0.0    Left Ulnar Anti Sensory (5th Digit)  32.2C  Wrist    3.0 <3.7 26.9 >15.0 Wrist 5th Digit 3.0 14.0 47 >38  Right Ulnar Anti Sensory (5th Digit)  31.8C  Wrist    3.2 <3.7 19.5 >15.0 Wrist 5th Digit 3.2 14.0 44 >38   Motor Summary Table   Stim Site NR Onset (ms) Norm Onset (ms) O-P Amp (mV) Norm O-P Amp Site1 Site2 Delta-0 (ms) Dist (cm) Vel (m/s) Norm Vel (m/s)  Left Median Motor (Abd  Poll Brev)  32C  Wrist    3.2 <4.2 10.3 >5 Elbow Wrist 3.8 21.5 57 >50  Elbow    7.0  10.3         Right Median Motor (Abd Poll Brev)  32.1C  Wrist    3.2 <4.2 7.3 >5 Elbow Wrist 3.8 22.0 58 >50  Elbow    7.0  6.7         Left Ulnar Motor (Abd Dig Min)  32.1C  Wrist    2.8 <4.2 8.3 >3 B Elbow Wrist 3.2 21.5 67 >53  B Elbow    6.0  8.2  A Elbow B Elbow 1.3 9.0 69 >53  A Elbow    7.3  8.6         Right Ulnar Motor (Abd Dig Min)  32.3C  Wrist    2.9 <4.2 9.6 >3 B Elbow Wrist 3.0 22.0 73 >53  B Elbow    5.9  9.0  A Elbow B Elbow 1.4 9.0 64 >53  A Elbow    7.3  8.7          EMG   Side Muscle Nerve Root Ins Act Fibs Psw Amp Dur Poly Recrt Int Dennie BiblePat Comment  Right Abd Poll Brev Median C8-T1 Nml Nml Nml Nml Nml 0 Nml Nml   Right 1stDorInt Ulnar C8-T1 Nml Nml Nml Nml Nml 0 Nml Nml   Right PronatorTeres Median C6-7 Nml Nml Nml Nml Nml 0 Nml Nml   Right Biceps Musculocut C5-6 Nml Nml Nml Nml Nml 0 Nml Nml   Right Deltoid Axillary C5-6 Nml Nml Nml Nml Nml 0 Nml Nml     Nerve Conduction Studies Anti Sensory Left/Right Comparison   Stim Site L Lat (ms) R Lat (ms) L-R Lat (ms) L Amp (V) R Amp (V) L-R Amp (%) Site1 Site2 L Vel (m/s) R Vel (m/s) L-R Vel (m/s)  Median Acr Palm Anti Sensory (2nd Digit)  32.1C  Wrist 3.0 2.9 0.1 27.7 32.4 14.5 Wrist Palm     Palm 1.6 1.5 0.1 45.7 31.5 31.1       Radial Anti Sensory (Base 1st Digit)  32C  Wrist 2.1 2.0 0.1 21.8 20.4 6.4 Wrist Base 1st Digit     Ulnar Anti Sensory (5th Digit)  32.2C  Wrist 3.0 3.2 0.2 26.9 19.5 27.5 Wrist 5th Digit 47 44 3   Motor Left/Right Comparison   Stim Site L Lat (ms) R Lat (ms) L-R Lat (ms) L Amp (mV) R Amp (mV) L-R Amp (%) Site1 Site2 L Vel (m/s) R Vel (m/s) L-R Vel (m/s)  Median Motor (Abd Poll Brev)  32C  Wrist 3.2 3.2 0.0 10.3 7.3 29.1 Elbow Wrist 57 58 1  Elbow 7.0 7.0 0.0 10.3 6.7 35.0       Ulnar Motor (Abd Dig Min)  32.1C  Wrist 2.8 2.9 0.1 8.3 9.6 13.5 B Elbow Wrist 67 73 6  B Elbow 6.0 5.9 0.1 8.2  9.0 8.9 A Elbow B Elbow 69 64 5  A Elbow 7.3 7.3 0.0 8.6 8.7 1.1          Waveforms:

## 2017-01-17 NOTE — Progress Notes (Signed)
John Pearson - 46 y.o. male MRN [id number]  Date of birth: 07/06/1970  Office Visit Note: Visit Date: 01/13/2017 PCP: Patient, No Pcp Per Referred by: No ref. provider found  Subjective: Chief Complaint  Patient presents with  . Right Hand - Weakness  . Left Hand - Weakness   HPI: John Pearson is a 46 year old right-hand-dominant gentleman who recently saw Dr. Roda Shutters  for evaluation and management of his bilateral hand pain and shaking.  The patient reports chronic history of "random shaking "both hands especially when picking up heavy objects or when writing.  He denies any real numbness tingling or paresthesia.  He is felt weak in the hands at times.  He does play guitar and he has found it difficult to play at times.  He will have to rest his hands.  He does not note any cramping or burning pain.  He denies any trouble with balance or any associated headaches.  He has not had any vision difficulties.  He has not had prior electrodiagnostic studies.  He has very little documented medical history.    ROS Otherwise per HPI.  Assessment & Plan: Visit Diagnoses:  1. Paresthesia of skin   2. Tremor of both hands     Plan: No additional findings.  Impression: Essentially NORMAL electrodiagnostic study of both upper limbs.  There is no significant electrodiagnostic evidence of nerve entrapment, brachial plexopathy, cervical radiculopathy or generalized peripheral neuropathy.    As you know, purely sensory or demyelinating radiculopathies and chemical radiculitis may not be detected with this particular electrodiagnostic study.  Recommendations: 1.  Follow-up with referring physician. 2.  Continue current management of symptoms.  Consider neurology referral for movement disorder.  Recommend Dr. Arbutus Leas at [place].   Meds & Orders: No orders of the defined types were placed in this encounter.   Orders Placed This Encounter  Procedures  . NCV with EMG (electromyography)    Follow-up:  Return for Dr. Roda Shutters.   Procedures: No procedures performed  EMG & NCV Findings: All nerve conduction studies (as indicated in the following tables) were within normal limits.  All left vs. right side differences were within normal limits.    All examined muscles (as indicated in the following table) showed no evidence of electrical instability.    Impression: Essentially NORMAL electrodiagnostic study of both upper limbs.  There is no significant electrodiagnostic evidence of nerve entrapment, brachial plexopathy, cervical radiculopathy or generalized peripheral neuropathy.    As you know, purely sensory or demyelinating radiculopathies and chemical radiculitis may not be detected with this particular electrodiagnostic study.  Recommendations: 1.  Follow-up with referring physician. 2.  Continue current management of symptoms.  Consider neurology referral for movement disorder.  Recommend Dr. Arbutus Leas at [place].   Nerve Conduction Studies Anti Sensory Summary Table   Stim Site NR Peak (ms) Norm Peak (ms) P-T Amp (V) Norm P-T Amp Site1 Site2 Delta-P (ms) Dist (cm) Vel (m/s) Norm Vel (m/s)  Left Median Acr Palm Anti Sensory (2nd Digit)  32.1C  Wrist    3.0 <3.6 27.7 >10 Wrist Palm 1.4 0.0    Palm    1.6 <2.0 45.7         Right Median Acr Palm Anti Sensory (2nd Digit)  31.3C  Wrist    2.9 <3.6 32.4 >10 Wrist Palm 1.4 0.0    Palm    1.5 <2.0 31.5         Left Radial Anti Sensory (Base 1st Digit)  32C  Wrist    2.1 <3.1 21.8  Wrist Base 1st Digit 2.1 0.0    Right Radial Anti Sensory (Base 1st Digit)  32C  Wrist    2.0 <3.1 20.4  Wrist Base 1st Digit 2.0 0.0    Left Ulnar Anti Sensory (5th Digit)  32.2C  Wrist    3.0 <3.7 26.9 >15.0 Wrist 5th Digit 3.0 14.0 47 >38  Right Ulnar Anti Sensory (5th Digit)  31.8C  Wrist    3.2 <3.7 19.5 >15.0 Wrist 5th Digit 3.2 14.0 44 >38   Motor Summary Table   Stim Site NR Onset (ms) Norm Onset (ms) O-P Amp (mV) Norm O-P Amp Site1 Site2 Delta-0 (ms)  Dist (cm) Vel (m/s) Norm Vel (m/s)  Left Median Motor (Abd Poll Brev)  32C  Wrist    3.2 <4.2 10.3 >5 Elbow Wrist 3.8 21.5 57 >50  Elbow    7.0  10.3         Right Median Motor (Abd Poll Brev)  32.1C  Wrist    3.2 <4.2 7.3 >5 Elbow Wrist 3.8 22.0 58 >50  Elbow    7.0  6.7         Left Ulnar Motor (Abd Dig Min)  32.1C  Wrist    2.8 <4.2 8.3 >3 B Elbow Wrist 3.2 21.5 67 >53  B Elbow    6.0  8.2  A Elbow B Elbow 1.3 9.0 69 >53  A Elbow    7.3  8.6         Right Ulnar Motor (Abd Dig Min)  32.3C  Wrist    2.9 <4.2 9.6 >3 B Elbow Wrist 3.0 22.0 73 >53  B Elbow    5.9  9.0  A Elbow B Elbow 1.4 9.0 64 >53  A Elbow    7.3  8.7          EMG   Side Muscle Nerve Root Ins Act Fibs Psw Amp Dur Poly Recrt Int Dennie BiblePat Comment  Right Abd Poll Brev Median C8-T1 Nml Nml Nml Nml Nml 0 Nml Nml   Right 1stDorInt Ulnar C8-T1 Nml Nml Nml Nml Nml 0 Nml Nml   Right PronatorTeres Median C6-7 Nml Nml Nml Nml Nml 0 Nml Nml   Right Biceps Musculocut C5-6 Nml Nml Nml Nml Nml 0 Nml Nml   Right Deltoid Axillary C5-6 Nml Nml Nml Nml Nml 0 Nml Nml     Nerve Conduction Studies Anti Sensory Left/Right Comparison   Stim Site L Lat (ms) R Lat (ms) L-R Lat (ms) L Amp (V) R Amp (V) L-R Amp (%) Site1 Site2 L Vel (m/s) R Vel (m/s) L-R Vel (m/s)  Median Acr Palm Anti Sensory (2nd Digit)  32.1C  Wrist 3.0 2.9 0.1 27.7 32.4 14.5 Wrist Palm     Palm 1.6 1.5 0.1 45.7 31.5 31.1       Radial Anti Sensory (Base 1st Digit)  32C  Wrist 2.1 2.0 0.1 21.8 20.4 6.4 Wrist Base 1st Digit     Ulnar Anti Sensory (5th Digit)  32.2C  Wrist 3.0 3.2 0.2 26.9 19.5 27.5 Wrist 5th Digit 47 44 3   Motor Left/Right Comparison   Stim Site L Lat (ms) R Lat (ms) L-R Lat (ms) L Amp (mV) R Amp (mV) L-R Amp (%) Site1 Site2 L Vel (m/s) R Vel (m/s) L-R Vel (m/s)  Median Motor (Abd Poll Brev)  32C  Wrist 3.2 3.2 0.0 10.3 7.3 29.1 Elbow Wrist 57 58 1  Elbow 7.0 7.0 0.0 10.3 6.7 35.0       Ulnar Motor (Abd Dig Min)  32.1C  Wrist 2.8 2.9 0.1 8.3  9.6 13.5 B Elbow Wrist 67 73 6  B Elbow 6.0 5.9 0.1 8.2 9.0 8.9 A Elbow B Elbow 69 64 5  A Elbow 7.3 7.3 0.0 8.6 8.7 1.1          Waveforms:                     Clinical History: No specialty comments available.  He reports that he has been smoking.  he has never used smokeless tobacco. No results for input(s): HGBA1C, LABURIC in the last 8760 hours.  Objective:  VS:  HT:    WT:   BMI:     BP:   HR: bpm  TEMP: ( )  RESP:  Physical Exam  Musculoskeletal:  Inspection reveals no resting tremor and no atrophy of the bilateral APB or FDI or hand intrinsics. There is no swelling, color changes, allodynia or dystrophic changes. There is 5 out of 5 strength in the bilateral wrist extension, finger abduction and long finger flexion. There is intact sensation to light touch in all dermatomal and peripheral nerve distributions. There is a negative Phalen's test bilaterally. There is a negative Hoffmann's test bilaterally.    Ortho Exam Imaging: No results found.  Past Medical/Family/Surgical/Social History: Medications & Allergies reviewed per EMR There are no active problems to display for this patient.  History reviewed. No pertinent past medical history. History reviewed. No pertinent family history. Past Surgical History:  Procedure Laterality Date  . HERNIA REPAIR     Social History   Occupational History  . Not on file  Tobacco Use  . Smoking status: Current Every Day Smoker  . Smokeless tobacco: Never Used  Substance and Sexual Activity  . Alcohol use: No  . Drug use: No  . Sexual activity: Not on file

## 2017-02-06 ENCOUNTER — Telehealth (INDEPENDENT_AMBULATORY_CARE_PROVIDER_SITE_OTHER): Payer: Self-pay | Admitting: Orthopaedic Surgery

## 2017-02-06 NOTE — Telephone Encounter (Signed)
OV notes faxed to Narda Bondsynthia Ingram @ [facility] [phone]

## 2023-06-08 ENCOUNTER — Emergency Department (HOSPITAL_BASED_OUTPATIENT_CLINIC_OR_DEPARTMENT_OTHER): Payer: MEDICAID

## 2023-06-08 ENCOUNTER — Encounter (HOSPITAL_BASED_OUTPATIENT_CLINIC_OR_DEPARTMENT_OTHER): Payer: Self-pay | Admitting: Emergency Medicine

## 2023-06-08 ENCOUNTER — Emergency Department (HOSPITAL_BASED_OUTPATIENT_CLINIC_OR_DEPARTMENT_OTHER)
Admission: EM | Admit: 2023-06-08 | Discharge: 2023-06-08 | Disposition: A | Discharge status: Home / Self Care | Payer: MEDICAID | Attending: Emergency Medicine | Admitting: Emergency Medicine

## 2023-06-08 ENCOUNTER — Other Ambulatory Visit: Payer: Self-pay

## 2023-06-08 DIAGNOSIS — N132 Hydronephrosis with renal and ureteral calculous obstruction: Secondary | ICD-10-CM | POA: Diagnosis not present

## 2023-06-08 DIAGNOSIS — R112 Nausea with vomiting, unspecified: Secondary | ICD-10-CM | POA: Diagnosis present

## 2023-06-08 DIAGNOSIS — N201 Calculus of ureter: Secondary | ICD-10-CM

## 2023-06-08 DIAGNOSIS — R1111 Vomiting without nausea: Secondary | ICD-10-CM | POA: Diagnosis not present

## 2023-06-08 DIAGNOSIS — K4090 Unilateral inguinal hernia, without obstruction or gangrene, not specified as recurrent: Secondary | ICD-10-CM | POA: Insufficient documentation

## 2023-06-08 DIAGNOSIS — I10 Essential (primary) hypertension: Secondary | ICD-10-CM | POA: Diagnosis not present

## 2023-06-08 DIAGNOSIS — R197 Diarrhea, unspecified: Secondary | ICD-10-CM | POA: Diagnosis not present

## 2023-06-08 LAB — CBC WITH DIFFERENTIAL/PLATELET
Abs Immature Granulocytes: 0.07 10*3/uL (ref 0.00–0.07)
Basophils Absolute: 0.1 10*3/uL (ref 0.0–0.1)
Basophils Relative: 1 %
Eosinophils Absolute: 0 10*3/uL (ref 0.0–0.5)
Eosinophils Relative: 0 %
HCT: 45.3 % (ref 39.0–52.0)
Hemoglobin: 16 g/dL (ref 13.0–17.0)
Immature Granulocytes: 0 %
Lymphocytes Relative: 8 %
Lymphs Abs: 1.6 10*3/uL (ref 0.7–4.0)
MCH: 31.6 pg (ref 26.0–34.0)
MCHC: 35.3 g/dL (ref 30.0–36.0)
MCV: 89.3 fL (ref 80.0–100.0)
Monocytes Absolute: 1 10*3/uL (ref 0.1–1.0)
Monocytes Relative: 5 %
Neutro Abs: 16.6 10*3/uL — ABNORMAL HIGH (ref 1.7–7.7)
Neutrophils Relative %: 86 %
Platelets: 283 10*3/uL (ref 150–400)
RBC: 5.07 MIL/uL (ref 4.22–5.81)
RDW: 13 % (ref 11.5–15.5)
WBC: 19.4 10*3/uL — ABNORMAL HIGH (ref 4.0–10.5)
nRBC: 0 % (ref 0.0–0.2)

## 2023-06-08 LAB — COMPREHENSIVE METABOLIC PANEL WITH GFR
ALT: 26 U/L (ref 0–44)
AST: 20 U/L (ref 15–41)
Albumin: 4.4 g/dL (ref 3.5–5.0)
Alkaline Phosphatase: 73 U/L (ref 38–126)
Anion gap: 8 (ref 5–15)
BUN: 14 mg/dL (ref 6–20)
CO2: 28 mmol/L (ref 22–32)
Calcium: 9.2 mg/dL (ref 8.9–10.3)
Chloride: 103 mmol/L (ref 98–111)
Creatinine, Ser: 1.13 mg/dL (ref 0.61–1.24)
GFR, Estimated: >60 mL/min (ref >60)
Glucose, Bld: 98 mg/dL (ref 70–99)
Potassium: 3.7 mmol/L (ref 3.5–5.1)
Sodium: 139 mmol/L (ref 135–145)
Total Bilirubin: 0.5 mg/dL (ref 0.0–1.2)
Total Protein: 6.8 g/dL (ref 6.5–8.1)

## 2023-06-08 LAB — URINALYSIS, ROUTINE W REFLEX MICROSCOPIC
Bilirubin Urine: NEGATIVE
Glucose, UA: NEGATIVE mg/dL
Hgb urine dipstick: NEGATIVE
Ketones, ur: 15 mg/dL — AB
Leukocytes,Ua: NEGATIVE
Nitrite: NEGATIVE
Specific Gravity, Urine: 1.014 (ref 1.005–1.030)
pH: 7 (ref 5.0–8.0)

## 2023-06-08 LAB — LIPASE, BLOOD: Lipase: 12 U/L (ref 11–51)

## 2023-06-08 MED ORDER — TAMSULOSIN HCL 0.4 MG PO CAPS: 0.4000 mg | ORAL_CAPSULE | Freq: Every day | ORAL | 0 refills | Status: DC

## 2023-06-08 MED ORDER — ONDANSETRON HCL 4 MG/2ML IJ SOLN
4.0000 mg | Freq: Once | INTRAMUSCULAR | Status: AC
Start: 2023-06-08 — End: 2023-06-08
  Administered 2023-06-08: 4 mg via INTRAVENOUS
  Filled 2023-06-08: qty 2

## 2023-06-08 MED ORDER — KETOROLAC TROMETHAMINE 30 MG/ML IJ SOLN
15.0000 mg | Freq: Once | INTRAMUSCULAR | Status: AC
  Administered 2023-06-08: 15 mg via INTRAVENOUS
  Filled 2023-06-08: qty 1

## 2023-06-08 MED ORDER — HYDROCODONE-ACETAMINOPHEN 5-325 MG PO TABS: 1.0000 | ORAL_TABLET | ORAL | 0 refills | Status: DC | PRN

## 2023-06-08 MED ORDER — IOHEXOL 300 MG/ML  SOLN
100.0000 mL | Freq: Once | INTRAMUSCULAR | Status: AC | PRN
  Administered 2023-06-08: 100 mL via INTRAVENOUS

## 2023-06-08 MED ORDER — ONDANSETRON 4 MG PO TBDP: ORAL_TABLET | ORAL | 0 refills | Status: DC

## 2023-06-08 MED ORDER — SODIUM CHLORIDE 0.9 % IV BOLUS
1000.0000 mL | Freq: Once | INTRAVENOUS | Status: AC
  Administered 2023-06-08: 1000 mL via INTRAVENOUS

## 2023-06-08 MED ORDER — HYDROMORPHONE HCL 1 MG/ML IJ SOLN
0.5000 mg | Freq: Once | INTRAMUSCULAR | Status: AC
  Administered 2023-06-08: 0.5 mg via INTRAVENOUS
  Filled 2023-06-08: qty 1

## 2023-06-08 NOTE — ED Provider Notes (Signed)
 [place] EMERGENCY DEPARTMENT AT [facility] Provider Note   CSN: [id number] Arrival date & time: 06/08/23  0115     History  Chief Complaint  Patient presents with   Emesis    John Pearson is a 53 y.o. male.  Patient presents to the emergency department for evaluation of nausea, vomiting and diarrhea.  Patient reports that symptoms began several hours ago.  He thought it was something he ate.  He is experiencing pain and cramping around the bellybutton.       Home Medications Prior to Admission medications   Medication Sig Start Date End Date Taking? Authorizing Provider  HYDROcodone-acetaminophen (NORCO/VICODIN) 5-325 MG tablet Take 1 tablet by mouth every 4 (four) hours as needed for moderate pain (pain score 4-6). 06/08/23  Yes Gilda Crease, MD  ondansetron (ZOFRAN-ODT) 4 MG disintegrating tablet 4mg  ODT q4 hours prn nausea/vomit 06/08/23  Yes Karalynn Cottone, Canary Brim, MD  tamsulosin (FLOMAX) 0.4 MG CAPS capsule Take 1 capsule (0.4 mg total) by mouth daily. 06/08/23  Yes Marigene Erler, Canary Brim, MD  acetaminophen (TYLENOL) 500 MG tablet Take 500 mg by mouth every 6 (six) hours as needed for pain.    [provider]  docusate sodium (COLACE) 100 MG capsule Take 1 capsule (100 mg total) by mouth every 12 (twelve) hours. 02/21/13   Palumbo, April, MD  meloxicam (MOBIC) 7.5 MG tablet Take 1 tablet (7.5 mg total) by mouth daily. 02/21/13   Palumbo, April, MD  polyethylene glycol powder (GLYCOLAX/MIRALAX) powder Take 255 g (1 Container total) by mouth once. 02/21/13   Palumbo, April, MD  promethazine (PHENERGAN) 25 MG tablet Take 1 tablet (25 mg total) by mouth every 6 (six) hours as needed for nausea. 10/27/12   Ivonne Andrew, PA-C  senna-docusate (SENOKOT-S) 8.6-50 MG per tablet Take 1 tablet by mouth 2 (two) times daily. 02/21/13   Palumbo, April, MD      Allergies    Patient has no known allergies.    Review of Systems   Review of  Systems  Physical Exam Updated Vital Signs BP (!) 120/105   Pulse 84   Temp 98.2 F (36.8 C) (Oral)   Resp 20   Ht 5\' 10"  (1.778 m)   Wt 83.9 kg   SpO2 98%   BMI 26.54 kg/m  Physical Exam Vitals and nursing note reviewed.  Constitutional:      General: He is not in acute distress.    Appearance: He is well-developed.  HENT:     Head: Normocephalic and atraumatic.     Mouth/Throat:     Mouth: Mucous membranes are moist.  Eyes:     General: Vision grossly intact. Gaze aligned appropriately.     Extraocular Movements: Extraocular movements intact.     Conjunctiva/sclera: Conjunctivae normal.  Cardiovascular:     Rate and Rhythm: Normal rate and regular rhythm.     Pulses: Normal pulses.     Heart sounds: Normal heart sounds, S1 normal and S2 normal. No murmur heard.    No friction rub. No gallop.  Pulmonary:     Effort: Pulmonary effort is normal. No respiratory distress.     Breath sounds: Normal breath sounds.  Abdominal:     Palpations: Abdomen is soft.     Tenderness: There is generalized abdominal tenderness. There is no guarding or rebound.     Hernia: No hernia is present.  Musculoskeletal:        General: No swelling.     Cervical  back: Full passive range of motion without pain, normal range of motion and neck supple. No pain with movement, spinous process tenderness or muscular tenderness. Normal range of motion.     Right lower leg: No edema.     Left lower leg: No edema.  Skin:    General: Skin is warm and dry.     Capillary Refill: Capillary refill takes less than 2 seconds.     Findings: No ecchymosis, erythema, lesion or wound.  Neurological:     Mental Status: He is alert and oriented to person, place, and time.     GCS: GCS eye subscore is 4. GCS verbal subscore is 5. GCS motor subscore is 6.     Cranial Nerves: Cranial nerves 2-12 are intact.     Sensory: Sensation is intact.     Motor: Motor function is intact. No weakness or abnormal muscle tone.      Coordination: Coordination is intact.  Psychiatric:        Mood and Affect: Mood normal.        Speech: Speech normal.        Behavior: Behavior normal.     ED Results / Procedures / Treatments   Labs (all labs ordered are listed, but only abnormal results are displayed) Labs Reviewed  CBC WITH DIFFERENTIAL/PLATELET - Abnormal; Notable for the following components:      Result Value   WBC 19.4 (*)    Neutro Abs 16.6 (*)    All other components within normal limits  URINALYSIS, ROUTINE W REFLEX MICROSCOPIC - Abnormal; Notable for the following components:   Ketones, ur 15 (*)    Protein, ur TRACE (*)    All other components within normal limits  COMPREHENSIVE METABOLIC PANEL  LIPASE, BLOOD    EKG None  Radiology CT ABDOMEN PELVIS W CONTRAST Result Date: 06/08/2023 CLINICAL DATA:  Acute nonlocalized abdominal pain. Nausea, vomiting, and diarrhea. Epigastric pain and groin pain. EXAM: CT ABDOMEN AND PELVIS WITH CONTRAST TECHNIQUE: Multidetector CT imaging of the abdomen and pelvis was performed using the standard protocol following bolus administration of intravenous contrast. RADIATION DOSE REDUCTION: This exam was performed according to the departmental dose-optimization program which includes automated exposure control, adjustment of the mA and/or kV according to patient size and/or use of iterative reconstruction technique. CONTRAST:  OMNIPAQUE IOHEXOL 300 MG/ML  SOLN COMPARISON:  Abdominal series 02/21/2013. CT abdomen and pelvis 08/12/2009 FINDINGS: Lower chest: Lung bases are clear. Hepatobiliary: No focal liver abnormality is seen. No gallstones, gallbladder wall thickening, or biliary dilatation. Pancreas: Unremarkable. No pancreatic ductal dilatation or surrounding inflammatory changes. Spleen: Normal in size without focal abnormality. Adrenals/Urinary Tract: No adrenal gland nodules. Kidneys are symmetrical. Stone in the lower pole left kidney measuring 5 mm diameter.  Stone in the distal right ureter just above the ureterovesical junction measuring 4 mm diameter. No hydronephrosis or hydroureter. Bladder is normal. Stomach/Bowel: Stomach, small bowel, and colon are not abnormally distended. No wall thickening or inflammatory changes. Appendix is not identified. There is a very large right inguinal hernia containing multiple loops of small bowel. No proximal obstruction. No bowel wall thickening or pneumatosis. The right inguinal defect measures about 3.8 cm diameter. The scrotal portion of the hernia measures about 13.3 cm diameter. Vascular/Lymphatic: Aortic atherosclerosis. No enlarged abdominal or pelvic lymph nodes. Reproductive: Prostate is unremarkable. Other: No free air or free fluid in the abdomen. Small periumbilical hernia containing fat. Musculoskeletal: No acute or significant osseous findings. IMPRESSION: 1. Very  large right inguinal hernia containing multiple small bowel loops. No proximal obstruction. 2. 4 mm stone in the right ureterovesical junction with mild proximal obstruction. 3. 5 mm nonobstructing stone in the left kidney. Electronically Signed   By: Burman Nieves M.D.   On: 06/08/2023 03:31    Procedures Procedures    Medications Ordered in ED Medications  ketorolac (TORADOL) 30 MG/ML injection 15 mg (has no administration in time range)  HYDROmorphone (DILAUDID) injection 0.5 mg (has no administration in time range)  sodium chloride 0.9 % bolus 1,000 mL (1,000 mLs Intravenous New Bag/Given 06/08/23 0227)  ondansetron (ZOFRAN) injection 4 mg (4 mg Intravenous Given 06/08/23 0226)  iohexol (OMNIPAQUE) 300 MG/ML solution 100 mL (100 mLs Intravenous Contrast Given 06/08/23 0318)    ED Course/ Medical Decision Making/ A&P                                 Medical Decision Making Amount and/or Complexity of Data Reviewed Labs: ordered. Radiology: ordered.  Risk Prescription drug management.   Differential Diagnosis considered  includes, but not limited to: Cholelithiasis; cholecystitis; cholangitis; bowel obstruction; esophagitis; gastritis; peptic ulcer disease; pancreatitis; cardiac.  Presents to the emergency ferment for evaluation of abdominal pain with nausea, vomiting, diarrhea.  Patient reports that the pain is mostly in the mid abdomen but he has had some pain into the groin as well.  Lab work reveals leukocytosis, no other abnormality.  Patient underwent CT scan which shows inguinal hernia that does not have any complications but he also has an obstructive stone at the right UVJ.  Treated with analgesia, discharge.  Follow-up with urology and general surgery.        Final Clinical Impression(s) / ED Diagnoses Final diagnoses:  Ureterolithiasis  Unilateral inguinal hernia without obstruction or gangrene, recurrence not specified    Rx / DC Orders ED Discharge Orders          Ordered    HYDROcodone-acetaminophen (NORCO/VICODIN) 5-325 MG tablet  Every 4 hours PRN        06/08/23 0341    tamsulosin (FLOMAX) 0.4 MG CAPS capsule  Daily        06/08/23 0341    ondansetron (ZOFRAN-ODT) 4 MG disintegrating tablet        06/08/23 0342              Gilda Crease, MD 06/08/23 [phone]

## 2023-06-08 NOTE — ED Triage Notes (Addendum)
 Patient presents via EMS from home with c/o N/V/D since last night. Patient also reports epigastric pain and "groin pain"

## 2023-08-04 ENCOUNTER — Encounter (HOSPITAL_COMMUNITY): Admission: EM | Disposition: A | Discharge status: Home / Self Care | Payer: Self-pay | Source: Home / Self Care

## 2023-08-04 ENCOUNTER — Inpatient Hospital Stay (HOSPITAL_BASED_OUTPATIENT_CLINIC_OR_DEPARTMENT_OTHER)
Admission: EM | Admit: 2023-08-04 | Discharge: 2023-08-10 | DRG: 336 | Disposition: A | Discharge status: Home / Self Care | Payer: Self-pay | Attending: General Surgery | Admitting: General Surgery

## 2023-08-04 ENCOUNTER — Other Ambulatory Visit: Payer: Self-pay

## 2023-08-04 ENCOUNTER — Encounter (HOSPITAL_BASED_OUTPATIENT_CLINIC_OR_DEPARTMENT_OTHER): Payer: Self-pay

## 2023-08-04 ENCOUNTER — Emergency Department (HOSPITAL_BASED_OUTPATIENT_CLINIC_OR_DEPARTMENT_OTHER): Payer: Self-pay

## 2023-08-04 ENCOUNTER — Emergency Department (HOSPITAL_COMMUNITY): Payer: Self-pay | Admitting: Anesthesiology

## 2023-08-04 DIAGNOSIS — R109 Unspecified abdominal pain: Secondary | ICD-10-CM | POA: Diagnosis present

## 2023-08-04 DIAGNOSIS — K660 Peritoneal adhesions (postprocedural) (postinfection): Secondary | ICD-10-CM | POA: Diagnosis not present

## 2023-08-04 DIAGNOSIS — N200 Calculus of kidney: Secondary | ICD-10-CM | POA: Diagnosis not present

## 2023-08-04 DIAGNOSIS — K4030 Unilateral inguinal hernia, with obstruction, without gangrene, not specified as recurrent: Secondary | ICD-10-CM | POA: Diagnosis not present

## 2023-08-04 DIAGNOSIS — K56609 Unspecified intestinal obstruction, unspecified as to partial versus complete obstruction: Secondary | ICD-10-CM | POA: Diagnosis not present

## 2023-08-04 DIAGNOSIS — K4031 Unilateral inguinal hernia, with obstruction, without gangrene, recurrent: Secondary | ICD-10-CM | POA: Diagnosis not present

## 2023-08-04 DIAGNOSIS — Z886 Allergy status to analgesic agent status: Secondary | ICD-10-CM

## 2023-08-04 DIAGNOSIS — Z79899 Other long term (current) drug therapy: Secondary | ICD-10-CM | POA: Diagnosis not present

## 2023-08-04 DIAGNOSIS — F17200 Nicotine dependence, unspecified, uncomplicated: Secondary | ICD-10-CM | POA: Diagnosis present

## 2023-08-04 DIAGNOSIS — Z9889 Other specified postprocedural states: Secondary | ICD-10-CM | POA: Diagnosis present

## 2023-08-04 DIAGNOSIS — Z8719 Personal history of other diseases of the digestive system: Secondary | ICD-10-CM | POA: Diagnosis present

## 2023-08-04 DIAGNOSIS — K5669 Other partial intestinal obstruction: Secondary | ICD-10-CM | POA: Diagnosis not present

## 2023-08-04 DIAGNOSIS — K567 Ileus, unspecified: Secondary | ICD-10-CM | POA: Diagnosis not present

## 2023-08-04 DIAGNOSIS — K9189 Other postprocedural complications and disorders of digestive system: Secondary | ICD-10-CM | POA: Diagnosis not present

## 2023-08-04 DIAGNOSIS — K449 Diaphragmatic hernia without obstruction or gangrene: Secondary | ICD-10-CM | POA: Diagnosis not present

## 2023-08-04 DIAGNOSIS — Z743 Need for continuous supervision: Secondary | ICD-10-CM | POA: Diagnosis not present

## 2023-08-04 DIAGNOSIS — Z791 Long term (current) use of non-steroidal anti-inflammatories (NSAID): Secondary | ICD-10-CM | POA: Diagnosis not present

## 2023-08-04 DIAGNOSIS — K4090 Unilateral inguinal hernia, without obstruction or gangrene, not specified as recurrent: Secondary | ICD-10-CM | POA: Diagnosis not present

## 2023-08-04 DIAGNOSIS — R112 Nausea with vomiting, unspecified: Secondary | ICD-10-CM | POA: Diagnosis not present

## 2023-08-04 HISTORY — PX: INGUINAL HERNIA REPAIR: SHX194

## 2023-08-04 LAB — CBC WITH DIFFERENTIAL/PLATELET
Abs Immature Granulocytes: 0.06 10*3/uL (ref 0.00–0.07)
Basophils Absolute: 0.1 10*3/uL (ref 0.0–0.1)
Basophils Relative: 0 %
Eosinophils Absolute: 0 10*3/uL (ref 0.0–0.5)
Eosinophils Relative: 0 %
HCT: 44.3 % (ref 39.0–52.0)
Hemoglobin: 15.4 g/dL (ref 13.0–17.0)
Immature Granulocytes: 0 %
Lymphocytes Relative: 8 %
Lymphs Abs: 1.2 10*3/uL (ref 0.7–4.0)
MCH: 30.6 pg (ref 26.0–34.0)
MCHC: 34.8 g/dL (ref 30.0–36.0)
MCV: 88.1 fL (ref 80.0–100.0)
Monocytes Absolute: 0.5 10*3/uL (ref 0.1–1.0)
Monocytes Relative: 3 %
Neutro Abs: 14 10*3/uL — ABNORMAL HIGH (ref 1.7–7.7)
Neutrophils Relative %: 89 %
Platelets: 265 10*3/uL (ref 150–400)
RBC: 5.03 MIL/uL (ref 4.22–5.81)
RDW: 13 % (ref 11.5–15.5)
WBC: 15.9 10*3/uL — ABNORMAL HIGH (ref 4.0–10.5)
nRBC: 0 % (ref 0.0–0.2)

## 2023-08-04 LAB — COMPREHENSIVE METABOLIC PANEL WITH GFR
ALT: 25 U/L (ref 0–44)
AST: 20 U/L (ref 15–41)
Albumin: 4.4 g/dL (ref 3.5–5.0)
Alkaline Phosphatase: 77 U/L (ref 38–126)
Anion gap: 14 (ref 5–15)
BUN: 14 mg/dL (ref 6–20)
CO2: 23 mmol/L (ref 22–32)
Calcium: 9.9 mg/dL (ref 8.9–10.3)
Chloride: 103 mmol/L (ref 98–111)
Creatinine, Ser: 1.01 mg/dL (ref 0.61–1.24)
GFR, Estimated: >60 mL/min (ref >60)
Glucose, Bld: 124 mg/dL — ABNORMAL HIGH (ref 70–99)
Potassium: 3.8 mmol/L (ref 3.5–5.1)
Sodium: 140 mmol/L (ref 135–145)
Total Bilirubin: 0.4 mg/dL (ref 0.0–1.2)
Total Protein: 7.3 g/dL (ref 6.5–8.1)

## 2023-08-04 LAB — URINALYSIS, ROUTINE W REFLEX MICROSCOPIC
Bilirubin Urine: NEGATIVE
Glucose, UA: NEGATIVE mg/dL
Hgb urine dipstick: NEGATIVE
Ketones, ur: NEGATIVE mg/dL
Leukocytes,Ua: NEGATIVE
Nitrite: NEGATIVE
Specific Gravity, Urine: 1.019 (ref 1.005–1.030)
pH: 8.5 — ABNORMAL HIGH (ref 5.0–8.0)

## 2023-08-04 LAB — TYPE AND SCREEN
ABO/RH(D): B POS
Antibody Screen: NEGATIVE

## 2023-08-04 LAB — LIPASE, BLOOD: Lipase: 18 U/L (ref 11–51)

## 2023-08-04 LAB — ABO/RH: ABO/RH(D): B POS

## 2023-08-04 SURGERY — REPAIR, HERNIA, INGUINAL, ADULT
Anesthesia: General | Site: Groin | Laterality: Right

## 2023-08-04 MED ORDER — LACTATED RINGERS IV SOLN: INTRAVENOUS | Status: DC

## 2023-08-04 MED ORDER — KETAMINE HCL 50 MG/5ML IJ SOSY
PREFILLED_SYRINGE | INTRAMUSCULAR | Status: DC | PRN
  Administered 2023-08-04: 5 mg via INTRAVENOUS
  Administered 2023-08-04: 25 mg via INTRAVENOUS
  Administered 2023-08-04 (×2): 10 mg via INTRAVENOUS

## 2023-08-04 MED ORDER — MIDAZOLAM HCL 2 MG/2ML IJ SOLN
INTRAMUSCULAR | Status: AC
  Filled 2023-08-04: qty 2

## 2023-08-04 MED ORDER — ONDANSETRON HCL 4 MG/2ML IJ SOLN: 4.0000 mg | Freq: Once | INTRAMUSCULAR | Status: DC | PRN

## 2023-08-04 MED ORDER — TAMSULOSIN HCL 0.4 MG PO CAPS
0.4000 mg | ORAL_CAPSULE | Freq: Every day | ORAL | Status: DC
  Administered 2023-08-06 – 2023-08-09 (×4): 0.4 mg via ORAL
  Filled 2023-08-04 (×4): qty 1

## 2023-08-04 MED ORDER — ONDANSETRON HCL 4 MG/2ML IJ SOLN
4.0000 mg | INTRAMUSCULAR | Status: DC | PRN
  Administered 2023-08-04 – 2023-08-07 (×5): 4 mg via INTRAVENOUS
  Filled 2023-08-04 (×4): qty 2

## 2023-08-04 MED ORDER — CELECOXIB 200 MG PO CAPS
200.0000 mg | ORAL_CAPSULE | Freq: Once | ORAL | Status: DC
  Filled 2023-08-04: qty 1

## 2023-08-04 MED ORDER — LIDOCAINE 2% (20 MG/ML) 5 ML SYRINGE
INTRAMUSCULAR | Status: DC | PRN
  Administered 2023-08-04: 100 mg via INTRAVENOUS

## 2023-08-04 MED ORDER — HYDROMORPHONE HCL 1 MG/ML IJ SOLN
INTRAMUSCULAR | Status: DC | PRN
  Administered 2023-08-04 (×2): .5 mg via INTRAVENOUS

## 2023-08-04 MED ORDER — ORAL CARE MOUTH RINSE: 15.0000 mL | Freq: Once | OROMUCOSAL | Status: AC

## 2023-08-04 MED ORDER — AMISULPRIDE (ANTIEMETIC) 5 MG/2ML IV SOLN: 10.0000 mg | Freq: Once | INTRAVENOUS | Status: DC | PRN

## 2023-08-04 MED ORDER — PROPOFOL 10 MG/ML IV BOLUS
INTRAVENOUS | Status: DC | PRN
  Administered 2023-08-04: 150 mg via INTRAVENOUS

## 2023-08-04 MED ORDER — SUCCINYLCHOLINE CHLORIDE 200 MG/10ML IV SOSY
PREFILLED_SYRINGE | INTRAVENOUS | Status: DC | PRN
  Administered 2023-08-04: 200 mg via INTRAVENOUS

## 2023-08-04 MED ORDER — LACTATED RINGERS IV BOLUS
1000.0000 mL | Freq: Once | INTRAVENOUS | Status: AC
  Administered 2023-08-04: 1000 mL via INTRAVENOUS

## 2023-08-04 MED ORDER — DEXAMETHASONE SODIUM PHOSPHATE 10 MG/ML IJ SOLN
INTRAMUSCULAR | Status: AC
  Filled 2023-08-04: qty 1

## 2023-08-04 MED ORDER — PIPERACILLIN-TAZOBACTAM 3.375 G IVPB
INTRAVENOUS | Status: AC
  Filled 2023-08-04: qty 50

## 2023-08-04 MED ORDER — SODIUM CHLORIDE 0.9 % IV SOLN: 1.0000 g | Freq: Once | INTRAVENOUS | Status: DC

## 2023-08-04 MED ORDER — MORPHINE SULFATE (PF) 2 MG/ML IV SOLN
1.0000 mg | INTRAVENOUS | Status: DC | PRN
Start: 2023-08-04 — End: 2023-08-09
  Administered 2023-08-05 – 2023-08-07 (×7): 2 mg via INTRAVENOUS
  Filled 2023-08-04 (×7): qty 1

## 2023-08-04 MED ORDER — ROCURONIUM BROMIDE 10 MG/ML (PF) SYRINGE
PREFILLED_SYRINGE | INTRAVENOUS | Status: DC | PRN
  Administered 2023-08-04: 10 mg via INTRAVENOUS
  Administered 2023-08-04: 20 mg via INTRAVENOUS
  Administered 2023-08-04: 40 mg via INTRAVENOUS

## 2023-08-04 MED ORDER — CHLORHEXIDINE GLUCONATE 0.12 % MT SOLN
15.0000 mL | Freq: Once | OROMUCOSAL | Status: AC
  Administered 2023-08-04: 15 mL via OROMUCOSAL
  Filled 2023-08-04: qty 15

## 2023-08-04 MED ORDER — DEXMEDETOMIDINE HCL IN NACL 80 MCG/20ML IV SOLN
INTRAVENOUS | Status: DC | PRN
  Administered 2023-08-04: 8 ug via INTRAVENOUS
  Administered 2023-08-04 (×3): 4 ug via INTRAVENOUS

## 2023-08-04 MED ORDER — DEXAMETHASONE SODIUM PHOSPHATE 10 MG/ML IJ SOLN
INTRAMUSCULAR | Status: DC | PRN
  Administered 2023-08-04: 10 mg via INTRAVENOUS

## 2023-08-04 MED ORDER — ONDANSETRON HCL 4 MG/2ML IJ SOLN
4.0000 mg | Freq: Once | INTRAMUSCULAR | Status: AC
  Administered 2023-08-04: 4 mg via INTRAVENOUS
  Filled 2023-08-04: qty 2

## 2023-08-04 MED ORDER — PIPERACILLIN-TAZOBACTAM 3.375 G IVPB
3.3750 g | Freq: Once | INTRAVENOUS | Status: DC
Start: 2023-08-04 — End: 2023-08-09
  Filled 2023-08-04: qty 50

## 2023-08-04 MED ORDER — BUPIVACAINE LIPOSOME 1.3 % IJ SUSP
INTRAMUSCULAR | Status: AC
  Filled 2023-08-04: qty 20

## 2023-08-04 MED ORDER — FENTANYL CITRATE PF 50 MCG/ML IJ SOSY
25.0000 ug | PREFILLED_SYRINGE | Freq: Once | INTRAMUSCULAR | Status: AC
  Administered 2023-08-04: 25 ug via INTRAVENOUS
  Filled 2023-08-04: qty 1

## 2023-08-04 MED ORDER — ROCURONIUM BROMIDE 10 MG/ML (PF) SYRINGE
PREFILLED_SYRINGE | INTRAVENOUS | Status: AC
  Filled 2023-08-04: qty 10

## 2023-08-04 MED ORDER — CHLORHEXIDINE GLUCONATE 0.12 % MT SOLN
15.0000 mL | Freq: Once | OROMUCOSAL | Status: AC
  Administered 2023-08-04: 15 mL via OROMUCOSAL

## 2023-08-04 MED ORDER — MIDAZOLAM HCL 2 MG/2ML IJ SOLN
INTRAMUSCULAR | Status: DC | PRN
Start: 2023-08-04 — End: 2023-08-04
  Administered 2023-08-04: 2 mg via INTRAVENOUS

## 2023-08-04 MED ORDER — AMPICILLIN-SULBACTAM SODIUM 3 (2-1) G IJ SOLR: 3.0000 g | Freq: Once | INTRAMUSCULAR | Status: DC

## 2023-08-04 MED ORDER — HYDROMORPHONE HCL 1 MG/ML IJ SOLN
INTRAMUSCULAR | Status: AC
  Filled 2023-08-04: qty 0.5

## 2023-08-04 MED ORDER — BUPIVACAINE-EPINEPHRINE (PF) 0.25% -1:200000 IJ SOLN
INTRAMUSCULAR | Status: AC
  Filled 2023-08-04: qty 30

## 2023-08-04 MED ORDER — ONDANSETRON HCL 4 MG/2ML IJ SOLN
INTRAMUSCULAR | Status: AC
  Filled 2023-08-04: qty 2

## 2023-08-04 MED ORDER — KETAMINE HCL 50 MG/5ML IJ SOSY
PREFILLED_SYRINGE | INTRAMUSCULAR | Status: AC
  Filled 2023-08-04: qty 5

## 2023-08-04 MED ORDER — SUCCINYLCHOLINE CHLORIDE 200 MG/10ML IV SOSY
PREFILLED_SYRINGE | INTRAVENOUS | Status: AC
  Filled 2023-08-04: qty 10

## 2023-08-04 MED ORDER — FENTANYL CITRATE (PF) 100 MCG/2ML IJ SOLN: 25.0000 ug | INTRAMUSCULAR | Status: DC | PRN

## 2023-08-04 MED ORDER — GABAPENTIN 300 MG PO CAPS
300.0000 mg | ORAL_CAPSULE | ORAL | Status: DC
Start: 2023-08-04 — End: 2023-08-04
  Filled 2023-08-04: qty 1

## 2023-08-04 MED ORDER — IOHEXOL 300 MG/ML  SOLN
100.0000 mL | Freq: Once | INTRAMUSCULAR | Status: AC | PRN
  Administered 2023-08-04: 100 mL via INTRAVENOUS

## 2023-08-04 MED ORDER — SUGAMMADEX SODIUM 200 MG/2ML IV SOLN
INTRAVENOUS | Status: DC | PRN
  Administered 2023-08-04: 200 mg via INTRAVENOUS

## 2023-08-04 MED ORDER — LIDOCAINE 2% (20 MG/ML) 5 ML SYRINGE
INTRAMUSCULAR | Status: AC
  Filled 2023-08-04: qty 5

## 2023-08-04 SURGICAL SUPPLY — 57 items
BAG COUNTER SPONGE SURGICOUNT (BAG) ×1 IMPLANT
BIOPATCH RED 1 DISK 7.0 (GAUZE/BANDAGES/DRESSINGS) IMPLANT
BLADE CLIPPER SURG (BLADE) IMPLANT
CHLORAPREP W/TINT 26 (MISCELLANEOUS) ×1 IMPLANT
COVER SURGICAL LIGHT HANDLE (MISCELLANEOUS) ×1 IMPLANT
DERMABOND ADVANCED .7 DNX12 (GAUZE/BANDAGES/DRESSINGS) ×1 IMPLANT
DRAIN CHANNEL 19F RND (DRAIN) IMPLANT
DRAIN PENROSE .5X12 LATEX STL (DRAIN) ×1 IMPLANT
DRAIN PENROSE 0.5X18 (DRAIN) IMPLANT
DRAPE LAPAROSCOPIC ABDOMINAL (DRAPES) IMPLANT
DRAPE LAPAROTOMY TRNSV 102X78 (DRAPES) ×1 IMPLANT
DRSG OPSITE POSTOP 4X6 (GAUZE/BANDAGES/DRESSINGS) IMPLANT
DRSG OPSITE POSTOP 4X8 (GAUZE/BANDAGES/DRESSINGS) IMPLANT
DRSG TEGADERM 4X4.75 (GAUZE/BANDAGES/DRESSINGS) IMPLANT
ELECT CAUTERY BLADE 6.4 (BLADE) IMPLANT
ELECTRODE BLDE 4.0 EZ CLN MEGD (MISCELLANEOUS) IMPLANT
ELECTRODE REM PT RTRN 9FT ADLT (ELECTROSURGICAL) ×1 IMPLANT
EVACUATOR SILICONE 100CC (DRAIN) IMPLANT
GAUZE 4X4 16PLY ~~LOC~~+RFID DBL (SPONGE) ×1 IMPLANT
GLOVE BIO SURGEON STRL SZ 6 (GLOVE) IMPLANT
GLOVE BIO SURGEON STRL SZ 6.5 (GLOVE) IMPLANT
GLOVE BIO SURGEON STRL SZ7 (GLOVE) ×1 IMPLANT
GLOVE BIOGEL PI IND STRL 6.5 (GLOVE) IMPLANT
GLOVE SURG SS PI 6.5 STRL IVOR (GLOVE) IMPLANT
GOWN STRL REUS W/ TWL LRG LVL3 (GOWN DISPOSABLE) ×2 IMPLANT
KIT BASIN OR (CUSTOM PROCEDURE TRAY) ×1 IMPLANT
KIT TURNOVER KIT B (KITS) ×1 IMPLANT
MARKER SKIN DUAL TIP RULER LAB (MISCELLANEOUS) ×1 IMPLANT
NDL HYPO 25GX1X1/2 BEV (NEEDLE) ×1 IMPLANT
NEEDLE HYPO 25GX1X1/2 BEV (NEEDLE) ×1 IMPLANT
NS IRRIG 1000ML POUR BTL (IV SOLUTION) ×1 IMPLANT
PACK GENERAL/GYN (CUSTOM PROCEDURE TRAY) ×1 IMPLANT
PAD ARMBOARD POSITIONER FOAM (MISCELLANEOUS) ×1 IMPLANT
SPONGE INTESTINAL PEANUT (DISPOSABLE) IMPLANT
SPONGE T-LAP 18X18 ~~LOC~~+RFID (SPONGE) IMPLANT
STAPLER SKIN PROX 35W (STAPLE) IMPLANT
SUT ETHILON 2 0 FS 18 (SUTURE) IMPLANT
SUT MNCRL AB 4-0 PS2 18 (SUTURE) ×1 IMPLANT
SUT PDS AB 1 TP1 96 (SUTURE) IMPLANT
SUT PDS AB 2-0 CT1 27 (SUTURE) IMPLANT
SUT PROLENE 0 SH 30 (SUTURE) IMPLANT
SUT PROLENE 2 0 SH DA (SUTURE) ×1 IMPLANT
SUT SILK 0 TIES 10X30 (SUTURE) IMPLANT
SUT SILK 2 0 SH (SUTURE) IMPLANT
SUT SILK 2 0SH CR/8 30 (SUTURE) IMPLANT
SUT SILK 2-0 18XBRD TIE 12 (SUTURE) IMPLANT
SUT SILK 3 0 SH 30 (SUTURE) IMPLANT
SUT VIC AB 0 UR5 27 (SUTURE) IMPLANT
SUT VIC AB 2-0 SH 27X BRD (SUTURE) IMPLANT
SUT VIC AB 2-0 SH 27XBRD (SUTURE) IMPLANT
SUT VIC AB 3-0 SH 27X BRD (SUTURE) ×1 IMPLANT
SUT VIC AB 3-0 SH 8-18 (SUTURE) IMPLANT
SUT VICRYL 0 UR6 27IN ABS (SUTURE) IMPLANT
SYR CONTROL 10ML LL (SYRINGE) ×1 IMPLANT
TOWEL GREEN STERILE (TOWEL DISPOSABLE) ×1 IMPLANT
TOWEL GREEN STERILE FF (TOWEL DISPOSABLE) ×1 IMPLANT
TRAY FOLEY W/BAG SLVR 14FR (SET/KITS/TRAYS/PACK) IMPLANT

## 2023-08-04 NOTE — Progress Notes (Signed)
 Attempted to call patient's mother to update.  No answer.

## 2023-08-04 NOTE — ED Notes (Signed)
 Called [facility] to transport patient to [facility] Pre-Op--(SBO) Dr. Davonna Estes is accepting

## 2023-08-04 NOTE — ED Notes (Signed)
 Patient transported to CT

## 2023-08-04 NOTE — Progress Notes (Signed)
 [facility] at bedside, leaving w pt. Zosyn to be hung en route per Rodena Citron RN

## 2023-08-04 NOTE — ED Provider Notes (Signed)
 [place] EMERGENCY DEPARTMENT AT [facility] Provider Note   CSN: [id number] Arrival date & time: 08/04/23  5621     History  Chief Complaint  Patient presents with   Abdominal Pain   Emesis    John Pearson is a 53 y.o. male.  HPI 53 year old male presents today complaining of nausea and vomiting x 2 this morning.  He states he had some diffuse abdominal pain and then had 2 episodes of severe vomiting.  He denies any blood or bile.  He has not had similar symptoms in the past.  It is not clear whether pain preceded vomiting.  He has a chronic right inguinal hernia that he describes as being at baseline.  He had some subjective fever and chills this morning prior to the onset.  He had 2 stools.  He describes the second 1 as being light-colored in nature.  He states that he has had inguinal hernia repaired twice.     Home Medications Prior to Admission medications   Medication Sig Start Date End Date Taking? Authorizing Provider  acetaminophen  (TYLENOL ) 500 MG tablet Take 500 mg by mouth every 6 (six) hours as needed for pain.    [provider]  docusate sodium  (COLACE) 100 MG capsule Take 1 capsule (100 mg total) by mouth every 12 (twelve) hours. 02/21/13   Palumbo, April, MD  HYDROcodone -acetaminophen  (NORCO/VICODIN) 5-325 MG tablet Take 1 tablet by mouth every 4 (four) hours as needed for moderate pain (pain score 4-6). 06/08/23   Ballard Bongo, MD  meloxicam  (MOBIC ) 7.5 MG tablet Take 1 tablet (7.5 mg total) by mouth daily. 02/21/13   Palumbo, April, MD  ondansetron  (ZOFRAN -ODT) 4 MG disintegrating tablet 4mg  ODT q4 hours prn nausea/vomit 06/08/23   Pollina, Marine Sia, MD  polyethylene glycol powder (GLYCOLAX /MIRALAX ) powder Take 255 g (1 Container total) by mouth once. 02/21/13   Palumbo, April, MD  promethazine  (PHENERGAN ) 25 MG tablet Take 1 tablet (25 mg total) by mouth every 6 (six) hours as needed for nausea. 10/27/12   Colie Dawes, PA-C   senna-docusate (SENOKOT-S) 8.6-50 MG per tablet Take 1 tablet by mouth 2 (two) times daily. 02/21/13   Palumbo, April, MD  tamsulosin  (FLOMAX ) 0.4 MG CAPS capsule Take 1 capsule (0.4 mg total) by mouth daily. 06/08/23   Ballard Bongo, MD      Allergies    Acetaminophen     Review of Systems   Review of Systems  Physical Exam Updated Vital Signs BP 123/71 (BP Location: Right Arm)   Pulse 61   Temp 97.9 F (36.6 C) (Oral)   Resp 18   Ht 1.778 m (5\' 10" )   Wt 90.7 kg   SpO2 99%   BMI 28.70 kg/m  Physical Exam Vitals reviewed.  HENT:     Head: Normocephalic.     Mouth/Throat:     Mouth: Mucous membranes are moist.  Eyes:     Extraocular Movements: Extraocular movements intact.  Cardiovascular:     Rate and Rhythm: Normal rate and regular rhythm.     Heart sounds: Normal heart sounds.  Pulmonary:     Effort: Pulmonary effort is normal.     Breath sounds: Normal breath sounds.  Abdominal:     General: Abdomen is flat. Bowel sounds are normal.     Palpations: Abdomen is soft.     Tenderness: There is abdominal tenderness in the right upper quadrant and right lower quadrant.     Hernia: A hernia is  present. Hernia is present in the right inguinal area.  Genitourinary:    Testes:        Right: Mass present.  Skin:    General: Skin is warm and dry.     Capillary Refill: Capillary refill takes less than 2 seconds.  Neurological:     General: No focal deficit present.     Mental Status: He is alert.  Psychiatric:        Mood and Affect: Mood normal.     ED Results / Procedures / Treatments   Labs (all labs ordered are listed, but only abnormal results are displayed) Labs Reviewed  URINALYSIS, ROUTINE W REFLEX MICROSCOPIC - Abnormal; Notable for the following components:      Result Value   pH 8.5 (*)    Protein, ur TRACE (*)    All other components within normal limits  CBC WITH DIFFERENTIAL/PLATELET - Abnormal; Notable for the following components:   WBC  15.9 (*)    Neutro Abs 14.0 (*)    All other components within normal limits  COMPREHENSIVE METABOLIC PANEL WITH GFR - Abnormal; Notable for the following components:   Glucose, Bld 124 (*)    All other components within normal limits  LIPASE, BLOOD    EKG None  Radiology CT ABDOMEN PELVIS W CONTRAST Result Date: 08/04/2023 EXAM: CT ABDOMEN AND PELVIS WITH CONTRAST 08/04/2023 11:30:21 AM TECHNIQUE: CT of the abdomen and pelvis was performed with the administration of intravenous contrast. Multiplanar reformatted images are provided for review. Automated exposure control, iterative reconstruction, and/or weight based adjustment of the mA/kV was utilized to reduce the radiation dose to as low as reasonably achievable. Aretta Kudo, this is doctor madden with radiology. Could I speak with doctor Meghin Thivierge, please COMPARISON: CT of the abdomen and pelvis 06/08/2023. CLINICAL HISTORY: Abdominal pain, acute, nonlocalized; known right inguinal hernia. Nausea and vomiting onset today, generalized abdominal pain. History of inguinal hernia. FINDINGS: LOWER CHEST: A small hiatal hernia is present. LIVER: The liver is unremarkable. GALLBLADDER AND BILE DUCTS: Gallbladder is unremarkable. No biliary ductal dilatation. SPLEEN: No acute abnormality. PANCREAS: No acute abnormality. ADRENAL GLANDS: No acute abnormality. KIDNEYS, URETERS AND BLADDER: Two punctate nonobstructing stones are present at the lower pole of the left kidney. Previously noted stone in the distal right ureter has passed. No evidence of hydronephrosis. No evidence of perinephric or periureteral stranding. Urinary bladder is unremarkable. GI AND BOWEL: The large right inguinal hernia is again noted. Small bowel proximal to the hernia is now mildly dilated, measuring up to 3.2 cm. Small bowel distal to the hernia is collapsed. A separate loop of emerges from the hernia and then continues back into the hernia. This loop demonstrates marked wall thickening  suggesting ischemia. The colon is mostly collapsed. No evidence of appendicitis. PERITONEUM AND RETROPERITONEUM: No evidence of ascites. No free air. VASCULATURE: Aorta is normal in caliber. LYMPH NODES: No evidence of lymphadenopathy. REPRODUCTIVE ORGANS: No acute abnormality. BONES AND SOFT TISSUES: No acute osseous abnormality. No focal soft tissue abnormality. IMPRESSION: 1. Large right inguinal hernia with associated small bowel obstruction and a loop of bowel extending of the hernia demonstrating marked wall thickening, suggestive of ischemia. 2. Small hiatal hernia. 3. Two punctate nonobstructing stones at the lower pole of the left kidney. Electronically signed by: Audree Leas MD 08/04/2023 01:49 PM EDT RP Workstation: [id number]    Procedures Procedures    Medications Ordered in ED Medications  fentaNYL  (SUBLIMAZE ) injection 25 mcg (has no administration in time  range)  lactated ringers bolus 1,000 mL (0 mLs Intravenous Stopped 08/04/23 1100)  ondansetron  (ZOFRAN ) injection 4 mg (4 mg Intravenous Given 08/04/23 1039)  fentaNYL (SUBLIMAZE) injection 25 mcg (25 mcg Intravenous Given 08/04/23 1031)  iohexol  (OMNIPAQUE ) 300 MG/ML solution 100 mL (100 mLs Intravenous Contrast Given 08/04/23 1121)    ED Course/ Medical Decision Making/ A&P                                 Medical Decision Making Amount and/or Complexity of Data Reviewed Labs: ordered. Radiology: ordered.  Risk Prescription drug management.   53 year old male with right inguinal hernia presents today with abdominal pain nausea and vomiting. Differential diagnosis includes but is not limited to bowel obstruction, colitis, incarceration of hernia, appendicitis, UTI, other intra-abdominal etiologies are considered. Patient evaluated with labs and with CT of abdomen.  Patient has large right inguinal hernia with associated small bowel obstruction and with bowel extending from the hernia with marked wall thickening  which is concerning for ischemia.  Care was discussed with Dr. Vanda Gemma. Care discussed with Marlin Simmonds, on-call for general surgery.  Patient is to be transported to preop.  I have discussed plan with patient and he voices understanding.        Final Clinical Impression(s) / ED Diagnoses Final diagnoses:  Incarcerated inguinal hernia  SBO (small bowel obstruction) (HCC)    Rx / DC Orders ED Discharge Orders     None         Auston Blush, MD 08/04/23 1407

## 2023-08-04 NOTE — Anesthesia Preprocedure Evaluation (Addendum)
 Anesthesia Evaluation  Patient identified by MRN, date of birth, ID band Patient awake    Reviewed: Allergy & Precautions, H&P , NPO status , Patient's Chart, lab work & pertinent test results  Airway Mallampati: II  TM Distance: >3 FB Neck ROM: Full    Dental no notable dental hx. (+) Poor Dentition, Chipped, Missing, Dental Advisory Given,    Pulmonary neg pulmonary ROS, Current Smoker   Pulmonary exam normal breath sounds clear to auscultation       Cardiovascular Exercise Tolerance: Good negative cardio ROS Normal cardiovascular exam Rhythm:Regular Rate:Normal     Neuro/Psych negative neurological ROS  negative psych ROS   GI/Hepatic negative GI ROS, Neg liver ROS,,,  Endo/Other  negative endocrine ROS    Renal/GU negative Renal ROS  negative genitourinary   Musculoskeletal negative musculoskeletal ROS (+)    Abdominal   Peds negative pediatric ROS (+)  Hematology negative hematology ROS (+)   Anesthesia Other Findings   Reproductive/Obstetrics negative OB ROS                             Anesthesia Physical Anesthesia Plan  ASA: 2 and emergent  Anesthesia Plan: General   Post-op Pain Management: Minimal or no pain anticipated and Dilaudid  IV   Induction: Intravenous, Cricoid pressure planned and Rapid sequence  PONV Risk Score and Plan: 2 and Ondansetron , Dexamethasone and Treatment may vary due to age or medical condition  Airway Management Planned: Oral ETT  Additional Equipment: None  Intra-op Plan:   Post-operative Plan: Extubation in OR  Informed Consent: I have reviewed the patients History and Physical, chart, labs and discussed the procedure including the risks, benefits and alternatives for the proposed anesthesia with the patient or authorized representative who has indicated his/her understanding and acceptance.       Plan Discussed with: Anesthesiologist  and CRNA  Anesthesia Plan Comments: (  )        Anesthesia Quick Evaluation

## 2023-08-04 NOTE — Anesthesia Postprocedure Evaluation (Signed)
 Anesthesia Post Note  Patient: John Pearson  Procedure(s) Performed: REPAIR, HERNIA, INGUINAL, ADULT (Right: Groin)     Patient location during evaluation: PACU Anesthesia Type: General Level of consciousness: awake and alert Pain management: pain level controlled Vital Signs Assessment: post-procedure vital signs reviewed and stable Respiratory status: spontaneous breathing, nonlabored ventilation and respiratory function stable Cardiovascular status: blood pressure returned to baseline and stable Postop Assessment: no apparent nausea or vomiting Anesthetic complications: no   No notable events documented.  Last Vitals:  Vitals:   08/04/23 2015 08/04/23 2037  BP: 138/87 (!) 151/99  Pulse: 90 (!) 105  Resp: (!) 23 18  Temp: 36.9 C   SpO2: 96% 96%    Last Pain:  Vitals:   08/04/23 2037  TempSrc: Oral  PainSc:                  Erin Havers

## 2023-08-04 NOTE — ED Notes (Signed)
 Pt w one episode of emesis in bed; clean gown/ linens provided, pt voiced "reflux might be feeling a little better, but I knew something was coming."

## 2023-08-04 NOTE — Transfer of Care (Signed)
 Immediate Anesthesia Transfer of Care Note  Patient: SHIVEN JUNIOUS  Procedure(s) Performed: REPAIR, HERNIA, INGUINAL, ADULT (Right: Groin)  Patient Location: PACU  Anesthesia Type:General  Level of Consciousness: drowsy  Airway & Oxygen Therapy: Patient Spontanous Breathing and Patient connected to face mask oxygen  Post-op Assessment: Report given to RN and Post -op Vital signs reviewed and stable  Post vital signs: Reviewed and stable  Last Vitals:  Vitals Value Taken Time  BP 140/88 08/04/23 1945  Temp    Pulse 110 08/04/23 1951  Resp 24 08/04/23 1951  SpO2 92 % 08/04/23 1951  Vitals shown include unfiled device data.  Last Pain:  Vitals:   08/04/23 1623  TempSrc:   PainSc: 6          Complications: No notable events documented.

## 2023-08-04 NOTE — ED Triage Notes (Signed)
 Pt POV from home c/o sudden onset NVD this morning, c/o generalized abd pain and groin pain. Known inguinal hernia. NAD during triage

## 2023-08-04 NOTE — H&P (Signed)
 John Pearson Aug 30, 1970  [id number].    Chief Complaint/Reason for Consult: incarcerated RIH with SBO and possible ischemia  HPI:  This is a 53 yo male with no significant PMH who has had a previous RIH x 2.  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  It has been previously fixed in 2008 by Dr. Brant Caldron.  This has since recurred but has not been causing problems until the last 24 hrs when he developed abdominal pain with N/V x2.  He states that the hernia became more painful.  He developed a subjective fever and chills.  He has had 2 BMs.    He presented to [facility] for further evaluation and noted to have a WBC of 15.9 with all other labs unremarkable.  He underwent a CT scan that reveals a large RIH with evidence of a SBO and thickening and fluid in the hernia concerning for ischemia.  He has been accepted in transfer to [facility] for surgical evaluation and likely intervention.  ROS: ROS: see HPI  History reviewed. No pertinent family history.  History reviewed. No pertinent past medical history.  Past Surgical History:  Procedure Laterality Date   HERNIA REPAIR      Social History:  reports that he has been smoking. He has never used smokeless tobacco. He reports that he does not drink alcohol and does not use drugs.  Allergies:  Allergies  Allergen Reactions   Acetaminophen  Nausea And Vomiting    (Not in a hospital admission)    Physical Exam: Blood pressure 122/80, pulse 65, temperature 98.2 F (36.8 C), temperature source Oral, resp. rate 20, height 5\' 10"  (1.778 m), weight 90.7 kg, SpO2 99%. General: pleasant, WD, WN white male who is laying in bed in NAD HEENT: head is normocephalic, atraumatic.  Sclera are noninjected.  PERRL.  Ears and nose without any masses or lesions.  Mouth is pink and moist Heart: regular, rate, and rhythm.  Normal s1,s2. No obvious murmurs, gallops, or rubs noted.  Palpable radial and  pedal pulses bilaterally Lungs: CTAB, no wheezes, rhonchi, or rales noted.  Respiratory effort nonlabored Abd: soft, NT, ND, +BS, no masses. Large right inguinal hernia, unable to be reduced MS: all 4 extremities are symmetrical with no cyanosis, clubbing, or edema. Skin: warm and dry with no masses, lesions, or rashes Neuro: Cranial nerves 2-12 grossly intact, sensation is normal throughout Psych: A&Ox3 with an appropriate affect.   Results for orders placed or performed during the hospital encounter of 08/04/23 (from the past 48 hours)  Urinalysis, Routine w reflex microscopic -Urine, Clean Catch     Status: Abnormal   Collection Time: 08/04/23 10:11 AM  Result Value Ref Range   Color, Urine YELLOW YELLOW   APPearance CLEAR CLEAR   Specific Gravity, Urine 1.019 1.005 - 1.030   pH 8.5 (H) 5.0 - 8.0   Glucose, UA NEGATIVE NEGATIVE mg/dL   Hgb urine dipstick NEGATIVE NEGATIVE   Bilirubin Urine NEGATIVE NEGATIVE   Ketones, ur NEGATIVE NEGATIVE mg/dL   Protein, ur TRACE (A) NEGATIVE mg/dL   Nitrite NEGATIVE NEGATIVE   Leukocytes,Ua NEGATIVE NEGATIVE    Comment: Performed at [facility], [address] [place], [place] [address]  CBC with Differential     Status: Abnormal   Collection Time: 08/04/23 10:12 AM  Result Value Ref Range   WBC 15.9 (H) 4.0 - 10.5 K/uL  RBC 5.03 4.22 - 5.81 MIL/uL   Hemoglobin 15.4 13.0 - 17.0 g/dL   HCT 16.1 09.6 - 04.5 %   MCV 88.1 80.0 - 100.0 fL   MCH 30.6 26.0 - 34.0 pg   MCHC 34.8 30.0 - 36.0 g/dL   RDW 40.9 81.1 - 91.4 %   Platelets 265 150 - 400 K/uL   nRBC 0.0 0.0 - 0.2 %   Neutrophils Relative % 89 %   Neutro Abs 14.0 (H) 1.7 - 7.7 K/uL   Lymphocytes Relative 8 %   Lymphs Abs 1.2 0.7 - 4.0 K/uL   Monocytes Relative 3 %   Monocytes Absolute 0.5 0.1 - 1.0 K/uL   Eosinophils Relative 0 %   Eosinophils Absolute 0.0 0.0 - 0.5 K/uL   Basophils Relative 0 %   Basophils Absolute 0.1 0.0 - 0.1 K/uL   Immature Granulocytes  0 %   Abs Immature Granulocytes 0.06 0.00 - 0.07 K/uL    Comment: Performed at [facility], [address] [place], [place] [address]  Comprehensive metabolic panel     Status: Abnormal   Collection Time: 08/04/23 10:12 AM  Result Value Ref Range   Sodium 140 135 - 145 mmol/L   Potassium 3.8 3.5 - 5.1 mmol/L   Chloride 103 98 - 111 mmol/L   CO2 23 22 - 32 mmol/L   Glucose, Bld 124 (H) 70 - 99 mg/dL    Comment: Glucose reference range applies only to samples taken after fasting for at least 8 hours.   BUN 14 6 - 20 mg/dL   Creatinine, Ser 6.21 0.61 - 1.24 mg/dL   Calcium 9.9 8.9 - 30.8 mg/dL   Total Protein 7.3 6.5 - 8.1 g/dL   Albumin 4.4 3.5 - 5.0 g/dL   AST 20 15 - 41 U/L   ALT 25 0 - 44 U/L   Alkaline Phosphatase 77 38 - 126 U/L   Total Bilirubin 0.4 0.0 - 1.2 mg/dL   GFR, Estimated >65 >78 mL/min    Comment: (NOTE) Calculated using the CKD-EPI Creatinine Equation (2021)    Anion gap 14 5 - 15    Comment: Performed at [facility], [address], [place], [place] [address]  Lipase, blood     Status: None   Collection Time: 08/04/23 10:12 AM  Result Value Ref Range   Lipase 18 11 - 51 U/L    Comment: Performed at [facility], [address] [place], [place] [address]   CT ABDOMEN PELVIS W CONTRAST Result Date: 08/04/2023 EXAM: CT ABDOMEN AND PELVIS WITH CONTRAST 08/04/2023 11:30:21 AM TECHNIQUE: CT of the abdomen and pelvis was performed with the administration of intravenous contrast. Multiplanar reformatted images are provided for review. Automated exposure control, iterative reconstruction, and/or weight based adjustment of the mA/kV was utilized to reduce the radiation dose to as low as reasonably achievable. Aretta Kudo, this is doctor madden with radiology. Could I speak with doctor ray, please COMPARISON: CT of the abdomen and pelvis 06/08/2023. CLINICAL HISTORY: Abdominal pain, acute, nonlocalized; known right  inguinal hernia. Nausea and vomiting onset today, generalized abdominal pain. History of inguinal hernia. FINDINGS: LOWER CHEST: A small hiatal hernia is present. LIVER: The liver is unremarkable. GALLBLADDER AND BILE DUCTS: Gallbladder is unremarkable. No biliary ductal dilatation. SPLEEN: No acute abnormality. PANCREAS: No acute abnormality. ADRENAL GLANDS: No acute abnormality. KIDNEYS, URETERS AND BLADDER: Two punctate nonobstructing stones are present at the lower pole of the left kidney. Previously noted stone  in the distal right ureter has passed. No evidence of hydronephrosis. No evidence of perinephric or periureteral stranding. Urinary bladder is unremarkable. GI AND BOWEL: The large right inguinal hernia is again noted. Small bowel proximal to the hernia is now mildly dilated, measuring up to 3.2 cm. Small bowel distal to the hernia is collapsed. A separate loop of emerges from the hernia and then continues back into the hernia. This loop demonstrates marked wall thickening suggesting ischemia. The colon is mostly collapsed. No evidence of appendicitis. PERITONEUM AND RETROPERITONEUM: No evidence of ascites. No free air. VASCULATURE: Aorta is normal in caliber. LYMPH NODES: No evidence of lymphadenopathy. REPRODUCTIVE ORGANS: No acute abnormality. BONES AND SOFT TISSUES: No acute osseous abnormality. No focal soft tissue abnormality. IMPRESSION: 1. Large right inguinal hernia with associated small bowel obstruction and a loop of bowel extending of the hernia demonstrating marked wall thickening, suggestive of ischemia. 2. Small hiatal hernia. 3. Two punctate nonobstructing stones at the lower pole of the left kidney. Electronically signed by: Audree Leas MD 08/04/2023 01:49 PM EDT RP Workstation: [id number]      Assessment/Plan Incarcerated recurrent RIH with SBO and concern for ischemia The patient has been seen, examined, labs, vitals, chart, and imaging personally reviewed.  He does  appear to have a transition point at the entrance of the hernia.  There is some thickening of his small bowel with some free fluid concerning for possible ischemia in the hernia.  We will plan to proceed with OR for [facility] repair, possible laparotomy, possible SBR.  [facility] 08/04/2023, 3:01 PM Please see Amion for pager number during day hours 7:00am-4:30pm or 7:00am -11:30am on weekends

## 2023-08-04 NOTE — Progress Notes (Signed)
 Pt arrive to unit from PACU via bed, pt drowsy but oriented x4, able to answer questions and make needs known, denies pain at this time, NGT to left nare, LIWS, JP drain to suction, midline incision w/ honeycomb, C/D/I, pt voided via urinal, call bell in reach, will monitor

## 2023-08-04 NOTE — Anesthesia Procedure Notes (Signed)
 Procedure Name: Intubation Date/Time: 08/04/2023 4:59 PM  Performed by: Aleera Gilcrease J, CRNAPre-anesthesia Checklist: Patient identified, Emergency Drugs available, Suction available and Patient being monitored Patient Re-evaluated:Patient Re-evaluated prior to induction Oxygen Delivery Method: Circle System Utilized Preoxygenation: Pre-oxygenation with 100% oxygen Induction Type: IV induction Ventilation: Mask ventilation without difficulty Laryngoscope Size: Glidescope and 4 Grade View: Grade I Tube type: Oral Tube size: 8.0 mm Number of attempts: 1 Airway Equipment and Method: Stylet and Oral airway Placement Confirmation: ETT inserted through vocal cords under direct vision, positive ETCO2 and breath sounds checked- equal and bilateral Secured at: 23 cm Tube secured with: Tape Dental Injury: Teeth and Oropharynx as per pre-operative assessment

## 2023-08-04 NOTE — Op Note (Signed)
   Operative Note   Date: 08/04/2023  Procedure: exploratory laparotomy, adhesiolysis x 1 hour, repair of recurrent, strangulated, right inguinal hernia  Pre-op diagnosis: Recurrent incarcerated right inguinal hernia Post-op diagnosis: Recurrent strangulated right direct inguinal hernia  Indication and clinical history: The patient is a 53 y.o. year old male with recurrent incarcerated right middle hernia with concern for strangulation     Surgeon: Anda Bamberg, MD Assistant: Lanell Pinta, MD  Anesthesiologist: Karis Outhouse, MD Anesthesia: General  Findings:  Specimen: Hernia sac EBL: 15 cc Drains/Implants: 76 French JP drain, right lower quadrant, terminating in the subcutaneous tissues of the scrotum  Disposition: PACU - hemodynamically stable.  Description of procedure: The patient was positioned supine on the operating room table. General anesthetic induction and intubation were uneventful. Foley catheter insertion was performed and was atraumatic. Time-out was performed verifying correct patient, procedure, signature of informed consent, and administration of pre-operative antibiotics. The abdomen and scrotum were prepped and draped in the usual sterile fashion.  A lower midline incision was made and deepened to the fascia until the peritoneal cavity was entered.  The small bowel was eviscerated and was normal in appearance until the terminal ileum which was clearly incarcerated in a right inguinal hernia.  Efforts were made at reduction, however extensive adhesiolysis was required including release of cicatrix from prior hernia repair.  Ultimately the lower midline incision had to be extended laterally to the right to expose and enter the hernia sac.  Once the hernia sac was entered additional adhesiolysis was performed to release the bowel.  This was performed the bowel was able to be reduced back into the abdominal cavity.  The bowel was ischemic in appearance and an unavoidable  mesenteric defect was created during efforts at reduction.  The hernia sac was resected and the defect closed via high ligation with a pursestring suture using 0 Vicryl suture.  The hernia sac was sent to pathology as a permanent specimen.  The testicle was reduced back into the scrotum and appeared viable.  An additional pursestring suture was used to close the defect from the intra-abdominal side.  The bowel was then carefully inspected and all appeared pink and viable.  Interloop adhesions were lysed.  Two unavoidable serosal injuries were repaired and the mesenteric defect that was created was closed.  The omentum was brought down over the small bowel and placed over the site of the hernia repair.  The fascia was closed with #1 looped PDS suture.  A 19 French JP drain was placed into the right scrotum.  A Vicryl suture was used to close the subcutaneous tissues over the drain.  The skin was reapproximated with staples.  Sterile dressings were applied. All sponge and instrument counts were correct at the conclusion of the procedure. The patient was awakened from anesthesia, extubated uneventfully, and transported to the PACU in good condition. There were no complications.      Anda Bamberg, MD General and Trauma Surgery [facility]

## 2023-08-05 ENCOUNTER — Encounter (HOSPITAL_COMMUNITY): Payer: Self-pay | Admitting: Surgery

## 2023-08-05 MED ORDER — KETOROLAC TROMETHAMINE 30 MG/ML IJ SOLN
30.0000 mg | Freq: Four times a day (QID) | INTRAMUSCULAR | Status: AC
  Administered 2023-08-05 – 2023-08-10 (×20): 30 mg via INTRAVENOUS
  Filled 2023-08-05 (×20): qty 1

## 2023-08-05 MED ORDER — OXYCODONE HCL 5 MG PO TABS
5.0000 mg | ORAL_TABLET | ORAL | Status: DC | PRN
  Administered 2023-08-06: 10 mg
  Filled 2023-08-05: qty 2

## 2023-08-05 MED ORDER — IBUPROFEN 100 MG/5ML PO SUSP: 600.0000 mg | Freq: Four times a day (QID) | ORAL | Status: DC | PRN

## 2023-08-05 MED ORDER — ENOXAPARIN SODIUM 40 MG/0.4ML IJ SOSY
40.0000 mg | PREFILLED_SYRINGE | INTRAMUSCULAR | Status: DC
  Administered 2023-08-05 – 2023-08-09 (×5): 40 mg via SUBCUTANEOUS
  Filled 2023-08-05 (×5): qty 0.4

## 2023-08-05 MED ORDER — SODIUM CHLORIDE 0.9 % IV SOLN
12.5000 mg | Freq: Four times a day (QID) | INTRAVENOUS | Status: DC | PRN
  Administered 2023-08-05 – 2023-08-10 (×2): 12.5 mg via INTRAVENOUS
  Filled 2023-08-05 (×3): qty 0.5

## 2023-08-05 MED ORDER — METHOCARBAMOL 1000 MG/10ML IJ SOLN
1000.0000 mg | Freq: Three times a day (TID) | INTRAMUSCULAR | Status: DC
  Administered 2023-08-05 – 2023-08-09 (×13): 1000 mg via INTRAVENOUS
  Filled 2023-08-05 (×13): qty 10

## 2023-08-05 NOTE — Progress Notes (Addendum)
  Subjective No acute events. Sore, hiccups. NG in place. No flatus/bm yet.  Objective: Vital signs in last 24 hours: Temp:  [97.9 F (36.6 C)-98.7 F (37.1 C)] 98 F (36.7 C) (05/24 0812) Pulse Rate:  [61-105] 102 (05/24 0812) Resp:  [16-24] 20 (05/24 0812) BP: (116-151)/(71-99) 131/94 (05/24 0812) SpO2:  [92 %-100 %] 96 % (05/24 0812) Weight:  [89.4 kg-90.7 kg] 90.7 kg (05/23 1006)    Intake/Output from previous day: 05/23 0701 - 05/24 0700 In: 2300 [I.V.:1300; IV Piggyback:1000] Out: 1220 [Urine:900; Emesis/NG output:200; Drains:20; Blood:100] Intake/Output this shift: No intake/output data recorded.  Gen: NAD, comfortable CV: RRR Pulm: Normal work of breathing Abd: Soft, mildly distended; incision dressed with honecombs, dry. Appropriate incisional soreness otherwise. Ext: SCDs in place  Lab Results: CBC  Recent Labs    08/04/23 1012  WBC 15.9*  HGB 15.4  HCT 44.3  PLT 265   BMET Recent Labs    08/04/23 1012  NA 140  K 3.8  CL 103  CO2 23  GLUCOSE 124*  BUN 14  CREATININE 1.01  CALCIUM 9.9   PT/INR No results for input(s): "LABPROT", "INR" in the last 72 hours. ABG No results for input(s): "PHART", "HCO3" in the last 72 hours.  Invalid input(s): "PCO2", "PO2"  Studies/Results:  Anti-infectives: Anti-infectives (From admission, onward)    Start     Dose/Rate Route Frequency Ordered Stop   08/04/23 1650  piperacillin-tazobactam (ZOSYN) 3.375 GM/50ML IVPB       Note to Pharmacy: Ardelia Beau, Destiny: cabinet override      08/04/23 1650 08/05/23 0459   08/04/23 1515  piperacillin-tazobactam (ZOSYN) IVPB 3.375 g        3.375 g 100 mL/hr over 30 Minutes Intravenous Once 08/04/23 1501     08/04/23 1030  Ampicillin-Sulbactam (UNASYN) 3 g in sodium chloride  0.9 % 100 mL IVPB  Status:  Discontinued        3 g 200 mL/hr over 30 Minutes Intravenous  Once 08/04/23 1019 08/04/23 1021   08/04/23 1015  meropenem (MERREM) 1 g in sodium chloride  0.9 % 100 mL  IVPB  Status:  Discontinued        1 g 200 mL/hr over 30 Minutes Intravenous  Once 08/04/23 1014 08/04/23 1015        Assessment/Plan: Patient Active Problem List   Diagnosis Date Noted   S/P right inguinal hernia repair 08/04/2023   s/p Procedure(s): REPAIR, HERNIA, INGUINAL, ADULT 08/04/2023  - We spent time reviewing with him his procedure, findings and plans, all questions answered. He expressed understanding/agreement with the plan  - NPO - NG tube to LIWS - MIVF per primary - Cont IV morphine - seems to help a lot; adding liquid ibuprofen per tube q6 hrs prn as well. Cont oxycodone - Ambulate 5x/day as able - PPX: SCDs; ok for chemical dvt prophylaxis from our perspective   LOS: 1 day   I spent a total of 35 minutes in both face-to-face and non-face-to-face activities, excluding procedures performed, for this visit on the date of this encounter.  Beatris Lincoln, MD [facility] Surgery, A [facility]

## 2023-08-05 NOTE — Evaluation (Signed)
 Physical Therapy Evaluation Patient Details Name: John Pearson MRN: [id number] DOB: 10-19-1970 Today's Date: 08/05/2023  History of Present Illness  Pt is 53 yo male who presents on 08/04/23 for inguinal hernia repair due to increased abdominal pain and N&V x2.  PMH: multiple hernia repairs.  Clinical Impression  Pt admitted with above diagnosis. Pt received in bed. 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 Pt needed min A to come to EOB but once up was able to ambulate 500' with CGA, slow gait. Preferred RW for pain control, recommend for home if still helpful by d/c. Will follow acutely but do not anticipate him needing PT at d/c.  Pt currently with functional limitations due to the deficits listed below (see PT Problem List). Pt will benefit from acute skilled PT to increase their independence and safety with mobility to allow discharge.           If plan is discharge home, recommend the following: Assist for transportation;Help with stairs or ramp for entrance;Assistance with cooking/housework   Can travel by private vehicle        Equipment Recommendations Rolling walker (2 wheels)  Recommendations for Other Services       Functional Status Assessment Patient has had a recent decline in their functional status and demonstrates the ability to make significant improvements in function in a reasonable and predictable amount of time.     Precautions / Restrictions Precautions Precautions: None Recall of Precautions/Restrictions: Intact Precaution/Restrictions Comments: NG tube, R JP drain Restrictions Weight Bearing Restrictions Per Provider Order: No      Mobility  Bed Mobility Overal bed mobility: Needs Assistance Bed Mobility: Rolling, Sidelying to Sit Rolling: Supervision Sidelying to sit: Min assist       General bed mobility comments: min A to transition from SL to sit, limited by pain    Transfers Overall transfer  level: Needs assistance Equipment used: Rolling walker (2 wheels), None Transfers: Sit to/from Stand Sit to Stand: Contact guard assist           General transfer comment: CGA for safety    Ambulation/Gait Ambulation/Gait assistance: Contact guard assist Gait Distance (Feet): 500 Feet Assistive device: Rolling walker (2 wheels) Gait Pattern/deviations: Step-through pattern, Trunk flexed Gait velocity: decreased Gait velocity interpretation: 1.31 - 2.62 ft/sec, indicative of limited community ambulator   General Gait Details: vc's for fwd gaze and erect posture. Pt preferred to push RW for pain control. 2 standing rest breaks  Stairs            Wheelchair Mobility     Tilt Bed    Modified Rankin (Stroke Patients Only)       Balance Overall balance assessment: Mild deficits observed, not formally tested                                           Pertinent Vitals/Pain Pain Assessment Pain Assessment: Faces Faces Pain Scale: Hurts even more Pain Location: abdomen Pain Descriptors / Indicators: Operative site guarding, Sore Pain Intervention(s): Limited activity within patient's tolerance, Monitored during session, Premedicated before session    Home Living Family/patient expects to be discharged to:: Private residence Living Arrangements: Parent Available Help at Discharge: Family;Available 24 hours/day Type of Home: House Home Access: Stairs to enter   [employer] of Steps: 6   Home Layout: Two level;Able  to live on main level with bedroom/bathroom Home Equipment: None Additional Comments: reports that mom is independent but does not drive. Pt reports he has brother and mother in law that can drive them    Prior Function Prior Level of Function : Independent/Modified Independent;Driving             Mobility Comments: independent, does lawn work ADLs Comments: independent     Extremity/Trunk Assessment   Upper  Extremity Assessment Upper Extremity Assessment: Overall WFL for tasks assessed    Lower Extremity Assessment Lower Extremity Assessment: Overall WFL for tasks assessed    Cervical / Trunk Assessment Cervical / Trunk Assessment: Kyphotic  Communication   Communication Communication: No apparent difficulties    Cognition Arousal: Lethargic Behavior During Therapy: Flat affect   PT - Cognitive impairments: No family/caregiver present to determine baseline                       PT - Cognition Comments: slow but appropriate, very flat Following commands: Intact       Cueing Cueing Techniques: Verbal cues     General Comments General comments (skin integrity, edema, etc.): VSS on RA. Education given on ambulating 5x/ day    Exercises     Assessment/Plan    PT Assessment Patient needs continued PT services  PT Problem List Decreased mobility;Decreased activity tolerance;Pain       PT Treatment Interventions DME instruction;Gait training;Stair training;Functional mobility training;Therapeutic activities;Therapeutic exercise;Patient/family education    PT Goals (Current goals can be found in the Care Plan section)  Acute Rehab PT Goals Patient Stated Goal: return home PT Goal Formulation: With patient Time For Goal Achievement: 08/19/23 Potential to Achieve Goals: Good    Frequency Min 2X/week     Co-evaluation               AM-PAC PT "6 Clicks" Mobility  Outcome Measure Help needed turning from your back to your side while in a flat bed without using bedrails?: None Help needed moving from lying on your back to sitting on the side of a flat bed without using bedrails?: A Little Help needed moving to and from a bed to a chair (including a wheelchair)?: A Little Help needed standing up from a chair using your arms (e.g., wheelchair or bedside chair)?: A Little Help needed to walk in hospital room?: A Little Help needed climbing 3-5 steps with a  railing? : A Little 6 Click Score: 19    End of Session   Activity Tolerance: Patient tolerated treatment well Patient left: in chair;with call bell/phone within reach Nurse Communication: Mobility status PT Visit Diagnosis: Difficulty in walking, not elsewhere classified (R26.2);Pain Pain - part of body:  (abdomen)    Time: 1478-2956 PT Time Calculation (min) (ACUTE ONLY): 27 min   Charges:   PT Evaluation $PT Eval Moderate Complexity: 1 Mod PT Treatments $Gait Training: 8-22 mins PT General Charges $$ ACUTE PT VISIT: 1 Visit         Amey Ka, PT  Acute Rehab Services Secure chat preferred Office [phone]   Deloris Fetters Carmel Waddington 08/05/2023, 4:11 PM

## 2023-08-05 NOTE — Plan of Care (Signed)

## 2023-08-06 MED ORDER — OXYCODONE HCL 5 MG PO TABS
5.0000 mg | ORAL_TABLET | ORAL | Status: DC | PRN
  Administered 2023-08-06 – 2023-08-09 (×2): 10 mg via ORAL
  Filled 2023-08-06 (×3): qty 2

## 2023-08-06 NOTE — Progress Notes (Signed)
  Subjective No acute events. Belly better today -no n/v, hiccups resolved. No flatus/BM yet. Minimal NG output  Objective: Vital signs in last 24 hours: Temp:  [98.2 F (36.8 C)-99.3 F (37.4 C)] 98.2 F (36.8 C) (05/25 0816) Pulse Rate:  [92-124] 118 (05/25 0816) Resp:  [16-18] 17 (05/25 0645) BP: (122-136)/(80-93) 136/93 (05/25 0816) SpO2:  [91 %-95 %] 92 % (05/25 0816) Last BM Date :  (unable to remember)  Intake/Output from previous day: 05/24 0701 - 05/25 0700 In: 535 [P.O.:480; NG/GT:30; IV Piggyback:25] Out: 980 [Urine:500; Emesis/NG output:50; Drains:30; Blood:400] Intake/Output this shift: No intake/output data recorded.  Gen: NAD, comfortable CV: RRR Pulm: Normal work of breathing Abd: Soft, mildly distended; incision dressed with honecombs, dry. Appropriate incisional soreness otherwise. JP serosanguinous Ext: SCDs in place  Lab Results: CBC  Recent Labs    08/04/23 1012  WBC 15.9*  HGB 15.4  HCT 44.3  PLT 265   BMET Recent Labs    08/04/23 1012  NA 140  K 3.8  CL 103  CO2 23  GLUCOSE 124*  BUN 14  CREATININE 1.01  CALCIUM 9.9   PT/INR No results for input(s): "LABPROT", "INR" in the last 72 hours. ABG No results for input(s): "PHART", "HCO3" in the last 72 hours.  Invalid input(s): "PCO2", "PO2"  Studies/Results:  Anti-infectives: Anti-infectives (From admission, onward)    Start     Dose/Rate Route Frequency Ordered Stop   08/04/23 1650  piperacillin-tazobactam (ZOSYN) 3.375 GM/50ML IVPB       Note to Pharmacy: Ardelia Beau, Destiny: cabinet override      08/04/23 1650 08/05/23 0459   08/04/23 1515  piperacillin-tazobactam (ZOSYN) IVPB 3.375 g        3.375 g 100 mL/hr over 30 Minutes Intravenous Once 08/04/23 1501     08/04/23 1030  Ampicillin-Sulbactam (UNASYN) 3 g in sodium chloride  0.9 % 100 mL IVPB  Status:  Discontinued        3 g 200 mL/hr over 30 Minutes Intravenous  Once 08/04/23 1019 08/04/23 1021   08/04/23 1015  meropenem  (MERREM) 1 g in sodium chloride  0.9 % 100 mL IVPB  Status:  Discontinued        1 g 200 mL/hr over 30 Minutes Intravenous  Once 08/04/23 1014 08/04/23 1015        Assessment/Plan: Patient Active Problem List   Diagnosis Date Noted   S/P right inguinal hernia repair 08/04/2023   s/p Procedure(s): REPAIR, HERNIA, INGUINAL, ADULT 08/04/2023  - NG with virtually no output. Clamp NG, clear liquids today; if tolerating liquids can D/C NG tube this afternoon - MIVF per primary - Cont IV morphine - seems to help a lot; added liquid ibuprofen per tube q6 hrs prn as well. Cont oxycodone - Ambulate 5x/day as able - PPX: SCDs; ok for chemical dvt prophylaxis from our perspective   LOS: 2 days   I spent a total of 35 minutes in both face-to-face and non-face-to-face activities, excluding procedures performed, for this visit on the date of this encounter.  Beatris Lincoln, MD [facility] Surgery, A [facility]

## 2023-08-06 NOTE — Plan of Care (Signed)
  Problem: Clinical Measurements: Goal: Will remain free from infection Outcome: Not Progressing   Problem: Clinical Measurements: Goal: Diagnostic test results will improve Outcome: Not Progressing   Problem: Elimination: Goal: Will not experience complications related to bowel motility Outcome: Not Progressing   Problem: Pain Managment: Goal: General experience of comfort will improve and/or be controlled Outcome: Not Progressing

## 2023-08-06 NOTE — Progress Notes (Addendum)
 Informed DR. Lovick that patient has vomited  small amount of brownish emesis noted on his gown, per NT Talia it was bright blood but this nurse did not see any fresh blood nearby. DR. Aniceto Barley ordered to hook back NGT to low intermittent suction and place patient on NPO with sips and meds. Patient advised on treatment plan. Also inquired with Dr. Aniceto Barley regarding persistent tachycardia if we need to put patient on telemetry and she said to continue vital signs q4 for now and no need for cardiac monitoring.

## 2023-08-06 NOTE — Evaluation (Signed)
 Occupational Therapy Evaluation Patient Details Name: John Pearson MRN: [id number] DOB: 1970/12/20 Today's Date: 08/06/2023   History of Present Illness   Pt is 53 yo male who presents on 08/04/23 for inguinal hernia repair due to increased abdominal pain and N&V x2.  PMH: multiple hernia repairs.     Clinical Impressions PTA, pt was independent and reports his mother lives with him. Upon eval, pt with pain, intermittent mild dizziness that subsides with rest. Pt performing LB ADL with CGA and UB ADL with set-up. Pt eager to mobilize in the hall. Needing up to CGA for mobility with RW; HR rising up to 148 max and needing external cues to implement rest break. Will follow acutely, but do not anticipate need for follow up OT after discharge.      If plan is discharge home, recommend the following:   A little help with walking and/or transfers;A little help with bathing/dressing/bathroom;Assistance with cooking/housework;Assist for transportation;Help with stairs or ramp for entrance     Functional Status Assessment   Patient has had a recent decline in their functional status and demonstrates the ability to make significant improvements in function in a reasonable and predictable amount of time.     Equipment Recommendations   None recommended by OT     Recommendations for Other Services         Precautions/Restrictions   Precautions Precautions: None Recall of Precautions/Restrictions: Intact Precaution/Restrictions Comments: NG tube, R JP drain Restrictions Weight Bearing Restrictions Per Provider Order: No     Mobility Bed Mobility Overal bed mobility: Needs Assistance Bed Mobility: Rolling, Sidelying to Sit Rolling: Supervision Sidelying to sit: Contact guard assist       General bed mobility comments: encouraged log roll technique    Transfers Overall transfer level: Needs assistance Equipment used: Rolling walker (2 wheels), None Transfers: Sit  to/from Stand Sit to Stand: Contact guard assist           General transfer comment: CGA for safety      Balance                                           ADL either performed or assessed with clinical judgement   ADL Overall ADL's : Needs assistance/impaired   Eating/Feeding Details (indicate cue type and reason): fluid diet Grooming: Standing;Contact guard assist   Upper Body Bathing: Set up;Sitting   Lower Body Bathing: Contact guard assist;Sit to/from stand   Upper Body Dressing : Set up;Sitting   Lower Body Dressing: Contact guard assist;Sit to/from stand   Toilet Transfer: Contact guard assist;Ambulation;Rolling walker (2 wheels)           Functional mobility during ADLs: Contact guard assist;Rolling walker (2 wheels)       Vision         Perception         Praxis         Pertinent Vitals/Pain Pain Assessment Pain Assessment: Faces Faces Pain Scale: Hurts little more Pain Location: abdomen Pain Descriptors / Indicators: Operative site guarding, Sore Pain Intervention(s): Limited activity within patient's tolerance, Monitored during session     Extremity/Trunk Assessment Upper Extremity Assessment Upper Extremity Assessment: Overall WFL for tasks assessed   Lower Extremity Assessment Lower Extremity Assessment: Overall WFL for tasks assessed   Cervical / Trunk Assessment Cervical / Trunk Assessment: Kyphotic   Communication Communication Communication: No  apparent difficulties   Cognition Arousal: Lethargic Behavior During Therapy: Flat affect Cognition: No apparent impairments             OT - Cognition Comments: Pt reports intermittently getting "swimmy headed" and losing track of new information, but [facility] for basic tasks assessed and pt oriented during session                 Following commands: Intact       Cueing  General Comments   Cueing Techniques: Verbal cues  HR up to 148 max with  mobility; pt benefitting from external cues for rest break. Pt reports he likes to push himself   Exercises     Shoulder Instructions      Home Living Family/patient expects to be discharged to:: Private residence Living Arrangements: Parent Available Help at Discharge: Family;Available 24 hours/day Type of Home: House Home Access: Stairs to enter [employer] of Steps: 6   Home Layout: Two level;Able to live on main level with bedroom/bathroom     Bathroom Shower/Tub: Tub/shower unit         Home Equipment: None   Additional Comments: reports that mom is independent but does not drive. Pt reports he has brother and mother in law that can drive them      Prior Functioning/Environment Prior Level of Function : Independent/Modified Independent;Driving             Mobility Comments: independent, does lawn work ADLs Comments: independent    OT Problem List: Decreased strength;Decreased activity tolerance;Impaired balance (sitting and/or standing);Decreased knowledge of use of DME or AE;Decreased knowledge of precautions;Pain   OT Treatment/Interventions: Therapeutic exercise;Self-care/ADL training;DME and/or AE instruction;Therapeutic activities;Balance training;Patient/family education      OT Goals(Current goals can be found in the care plan section)   Acute Rehab OT Goals Patient Stated Goal: get better OT Goal Formulation: With patient Time For Goal Achievement: 08/20/23 Potential to Achieve Goals: Good   OT Frequency:  Min 1X/week    Co-evaluation              AM-PAC OT "6 Clicks" Daily Activity     Outcome Measure Help from another person eating meals?: None Help from another person taking care of personal grooming?: A Little Help from another person toileting, which includes using toliet, bedpan, or urinal?: A Little Help from another person bathing (including washing, rinsing, drying)?: A Little Help from another person to put on and  taking off regular upper body clothing?: A Little Help from another person to put on and taking off regular lower body clothing?: A Little 6 Click Score: 19   End of Session Equipment Utilized During Treatment: Gait belt;Rolling walker (2 wheels) Nurse Communication: Mobility status  Activity Tolerance: Patient tolerated treatment well Patient left: in bed;with call bell/phone within reach;with bed alarm set  OT Visit Diagnosis: Unsteadiness on feet (R26.81);Muscle weakness (generalized) (M62.81);Pain Pain - part of body:  (abdomen)                Time: 1610-9604 OT Time Calculation (min): 42 min Charges:  OT General Charges $OT Visit: 1 Visit OT Evaluation $OT Eval Low Complexity: 1 Low OT Treatments $Therapeutic Activity: 23-37 mins  Karilyn Ouch, OTR/L [facility] Acute Rehabilitation Office: [phone]   Emery Hans 08/06/2023, 5:44 PM

## 2023-08-06 NOTE — Plan of Care (Signed)

## 2023-08-07 NOTE — Progress Notes (Signed)
 Physical Therapy Treatment Patient Details Name: John Pearson MRN: [id number] DOB: 1970-04-30 Today's Date: 08/07/2023   History of Present Illness Pt is 53 yo male who presents on 08/04/23 for inguinal hernia repair due to increased abdominal pain and N&V x2.  PMH: multiple hernia repairs.    PT Comments  Pt making good progress.  He reports he has ambulated 7 laps on his own today.  Demonstrated safe STS and 500' of ambulation with RW with good balance and functional speed.  Pt also able to go up/down 5 steps with single rail and supervision.  Pt with goals met or nearly met/adequate for d/c from therapy.  Pt is below his baseline but at supervision/mod I level and expected to progress on his own with time and skilled therapy no longer indicated.     If plan is discharge home, recommend the following: Assist for transportation;Help with stairs or ramp for entrance;Assistance with cooking/housework   Can travel by private vehicle        Equipment Recommendations  Rolling walker (2 wheels)    Recommendations for Other Services       Precautions / Restrictions Precautions Precautions: None Precaution/Restrictions Comments: NG tube, R JP drain     Mobility  Bed Mobility               General bed mobility comments: Pt was up in chair but reports has been doing log rolling technique    Transfers Overall transfer level: Modified independent Equipment used: None Transfers: Sit to/from Stand Sit to Stand: Modified independent (Device/Increase time)           General transfer comment: Supervision during session but has been ambulating on his own    Ambulation/Gait Ambulation/Gait assistance: Modified independent (Device/Increase time) Gait Distance (Feet): 500 Feet Assistive device: Rolling walker (2 wheels) Gait Pattern/deviations: Step-through pattern, Trunk flexed Gait velocity: functional     General Gait Details: Reports has made 7 laps on his own today.   Demonstrated safely with RW and functional speed.  Mild trunk flexion - cued for posture as able but reports pain/stretching   Stairs Stairs: Yes Stairs assistance: Supervision Stair Management: One rail Left, Alternating pattern, Step to pattern Number of Stairs: 5 General stair comments: Educated on "up with good and down with bad."  Pt did a variety of alternating vs step to.  He was steady with all, did report less pain with step to   Wheelchair Mobility     Tilt Bed    Modified Rankin (Stroke Patients Only)       Balance Overall balance assessment: Modified Independent   Sitting balance-Leahy Scale: Normal     Standing balance support: Bilateral upper extremity supported, No upper extremity supported Standing balance-Leahy Scale: Good Standing balance comment: Could stand without UE support; RW for comfort with ambulation                            Communication    Cognition Arousal: Alert Behavior During Therapy: WFL for tasks assessed/performed   PT - Cognitive impairments: No apparent impairments                                Cueing    Exercises      General Comments General comments (skin integrity, edema, etc.): Tolerated well.  Educated on continued use of RW for pain control - reports has  one at home he can use.  Also educated on positions of comfort for sleep (pillow under knees, head elevated, recliner if needed)      Pertinent Vitals/Pain Pain Assessment Pain Assessment: Faces Faces Pain Scale: Hurts a little bit Pain Location: abdomen Pain Descriptors / Indicators: Operative site guarding, Sore Pain Intervention(s): Limited activity within patient's tolerance, Monitored during session    Home Living                          Prior Function            PT Goals (current goals can now be found in the care plan section) Acute Rehab PT Goals PT Goal Formulation: All assessment and education complete, DC  therapy Progress towards PT goals: Goals met/education completed, patient discharged from PT (Goal met or are adequate for d/c; no skilled PT needs)    Frequency    Min 2X/week      PT Plan      Co-evaluation              AM-PAC PT "6 Clicks" Mobility   Outcome Measure  Help needed turning from your back to your side while in a flat bed without using bedrails?: None Help needed moving from lying on your back to sitting on the side of a flat bed without using bedrails?: A Little Help needed moving to and from a bed to a chair (including a wheelchair)?: None Help needed standing up from a chair using your arms (e.g., wheelchair or bedside chair)?: None Help needed to walk in hospital room?: None Help needed climbing 3-5 steps with a railing? : A Little 6 Click Score: 22    End of Session   Activity Tolerance: Patient tolerated treatment well Patient left: in chair;with call bell/phone within reach;with family/visitor present   PT Visit Diagnosis: Difficulty in walking, not elsewhere classified (R26.2);Pain     Time: 1610-9604 PT Time Calculation (min) (ACUTE ONLY): 17 min  Charges:    $Gait Training: 8-22 mins PT General Charges $$ ACUTE PT VISIT: 1 Visit                     Cyd Dowse, PT Acute Rehab Services [facility] [phone]    Carolynn Citrin 08/07/2023, 5:03 PM

## 2023-08-07 NOTE — Progress Notes (Signed)
 At 2150 patient vomited, per patient he is not nauseous but feels that something trickled his throat and made him gag, zofran  administered. Of note, he was laying flat on bed  as this nurse was changing his honeycomb dressing to ABD pad as per Dr. Davonna Estes order when he started gagging. This nurse immediately elevated patient's head. Attached NGT to suction for relief and informed Dr. Davonna Estes of situation.  At 2229 per Dr. Davonna Estes may continue top clamp NGT for now if patient is not nauseous but once there is distention or nausea and vomiting may restart NGT on low intermittent suction.

## 2023-08-07 NOTE — Progress Notes (Signed)
  Subjective No acute events. Feeling well no n/v, had some 'reflux' yesterday as was placed back on sxn. Minimal NG output  Objective: Vital signs in last 24 hours: Temp:  [97.8 F (36.6 C)-98.4 F (36.9 C)] 98.1 F (36.7 C) (05/26 0818) Pulse Rate:  [91-133] 91 (05/26 0818) Resp:  [15-20] 16 (05/26 0818) BP: (126-147)/(81-91) 128/91 (05/26 0818) SpO2:  [91 %-98 %] 98 % (05/26 0818) Last BM Date :  (unable to remember)  Intake/Output from previous day: 05/25 0701 - 05/26 0700 In: -  Out: 925 [Urine:700; Emesis/NG output:200; Drains:25] Intake/Output this shift: No intake/output data recorded.  Gen: NAD, comfortable CV: RRR Pulm: Normal work of breathing Abd: Soft, mildly distended; incision dressed with honecombs, dry. Appropriate incisional soreness otherwise. JP serosanguinous Ext: SCDs in place  Lab Results: CBC  Recent Labs    08/04/23 1012  WBC 15.9*  HGB 15.4  HCT 44.3  PLT 265   BMET Recent Labs    08/04/23 1012  NA 140  K 3.8  CL 103  CO2 23  GLUCOSE 124*  BUN 14  CREATININE 1.01  CALCIUM 9.9   PT/INR No results for input(s): "LABPROT", "INR" in the last 72 hours. ABG No results for input(s): "PHART", "HCO3" in the last 72 hours.  Invalid input(s): "PCO2", "PO2"  Studies/Results:  Anti-infectives: Anti-infectives (From admission, onward)    Start     Dose/Rate Route Frequency Ordered Stop   08/04/23 1650  piperacillin-tazobactam (ZOSYN) 3.375 GM/50ML IVPB       Note to Pharmacy: Ardelia Beau, Destiny: cabinet override      08/04/23 1650 08/05/23 0459   08/04/23 1515  piperacillin-tazobactam (ZOSYN) IVPB 3.375 g        3.375 g 100 mL/hr over 30 Minutes Intravenous Once 08/04/23 1501     08/04/23 1030  Ampicillin-Sulbactam (UNASYN) 3 g in sodium chloride  0.9 % 100 mL IVPB  Status:  Discontinued        3 g 200 mL/hr over 30 Minutes Intravenous  Once 08/04/23 1019 08/04/23 1021   08/04/23 1015  meropenem (MERREM) 1 g in sodium chloride  0.9 % 100  mL IVPB  Status:  Discontinued        1 g 200 mL/hr over 30 Minutes Intravenous  Once 08/04/23 1014 08/04/23 1015        Assessment/Plan: Patient Active Problem List   Diagnosis Date Noted   S/P right inguinal hernia repair 08/04/2023   s/p Procedure(s): REPAIR, HERNIA, INGUINAL, ADULT 08/04/2023  - NG with virtually no output. Retry Clamp NG, clear liquids today; if tolerating liquids can D/C NG tube this afternoon - MIVF per primary - Cont IV morphine - seems to help a lot; added liquid ibuprofen per tube q6 hrs prn as well. Cont oxycodone - Ambulate 5x/day as able - PPX: SCDs; ok for chemical dvt prophylaxis from our perspective   LOS: 3 days    Beatris Lincoln, MD [facility]

## 2023-08-07 NOTE — Plan of Care (Signed)
  Problem: Clinical Measurements: Goal: Will remain free from infection Outcome: Not Progressing   Problem: Clinical Measurements: Goal: Diagnostic test results will improve Outcome: Not Progressing   Problem: Clinical Measurements: Goal: Cardiovascular complication will be avoided Outcome: Not Progressing   Problem: Nutrition: Goal: Adequate nutrition will be maintained Outcome: Not Progressing   Problem: Elimination: Goal: Will not experience complications related to bowel motility Outcome: Not Progressing   Problem: Pain Managment: Goal: General experience of comfort will improve and/or be controlled Outcome: Not Progressing   Problem: Safety: Goal: Ability to remain free from injury will improve Outcome: Not Progressing

## 2023-08-08 NOTE — Progress Notes (Signed)
 Patient woke up and sat at the edge of bed accidentally pulling out his NG tube. Will not reinsert as per plan of Dr. Camilo Cella to initially remove it this afternoon.

## 2023-08-08 NOTE — Discharge Instructions (Signed)

## 2023-08-08 NOTE — Progress Notes (Signed)
  Subjective No acute events. Feeling well no n/v, tol cld with ng clamped  Objective: Vital signs in last 24 hours: Temp:  [97.9 F (36.6 C)-98.7 F (37.1 C)] 98.2 F (36.8 C) (05/27 0807) Pulse Rate:  [90-118] 98 (05/27 0807) Resp:  [16] 16 (05/27 0807) BP: (131-144)/(84-93) 144/93 (05/27 0807) SpO2:  [94 %-98 %] 98 % (05/27 0807) Last BM Date :  (before admission.)  Intake/Output from previous day: 05/26 0701 - 05/27 0700 In: -  Out: 235 [Urine:200; Drains:35] Intake/Output this shift: Total I/O In: -  Out: 30 [Drains:30]  Gen: NAD, comfortable CV: RRR Pulm: Normal work of breathing Abd: Soft, not significantly distended; incision dressed with honecombs, dry. Appropriate incisional soreness otherwise. JP serosanguinous Ext: SCDs in place  Lab Results: CBC  No results for input(s): "WBC", "HGB", "HCT", "PLT" in the last 72 hours.  BMET No results for input(s): "NA", "K", "CL", "CO2", "GLUCOSE", "BUN", "CREATININE", "CALCIUM" in the last 72 hours.  PT/INR No results for input(s): "LABPROT", "INR" in the last 72 hours. ABG No results for input(s): "PHART", "HCO3" in the last 72 hours.  Invalid input(s): "PCO2", "PO2"  Studies/Results:  Anti-infectives: Anti-infectives (From admission, onward)    Start     Dose/Rate Route Frequency Ordered Stop   08/04/23 1650  piperacillin-tazobactam (ZOSYN) 3.375 GM/50ML IVPB       Note to Pharmacy: Ardelia Beau, Destiny: cabinet override      08/04/23 1650 08/05/23 0459   08/04/23 1515  piperacillin-tazobactam (ZOSYN) IVPB 3.375 g        3.375 g 100 mL/hr over 30 Minutes Intravenous Once 08/04/23 1501     08/04/23 1030  Ampicillin-Sulbactam (UNASYN) 3 g in sodium chloride  0.9 % 100 mL IVPB  Status:  Discontinued        3 g 200 mL/hr over 30 Minutes Intravenous  Once 08/04/23 1019 08/04/23 1021   08/04/23 1015  meropenem (MERREM) 1 g in sodium chloride  0.9 % 100 mL IVPB  Status:  Discontinued        1 g 200 mL/hr over 30  Minutes Intravenous  Once 08/04/23 1014 08/04/23 1015        Assessment/Plan: Patient Active Problem List   Diagnosis Date Noted   S/P right inguinal hernia repair 08/04/2023   s/p Procedure(s): REPAIR, HERNIA, INGUINAL, ADULT 08/04/2023  - NG clamped and tol clear liquids; d/c ng, full liquids adat - MIVF per primary - Cont IV morphine - seems to help a lot; added liquid ibuprofen per tube q6 hrs prn as well. Cont oxycodone - Ambulate 5x/day as able - PPX: SCDs; ok for chemical dvt prophylaxis from our perspective   LOS: 4 days    Beatris Lincoln, MD [facility]

## 2023-08-08 NOTE — Plan of Care (Signed)
  Problem: Clinical Measurements: Goal: Diagnostic test results will improve Outcome: Not Progressing   Problem: Nutrition: Goal: Adequate nutrition will be maintained Outcome: Not Progressing   Problem: Elimination: Goal: Will not experience complications related to bowel motility Outcome: Not Progressing   Problem: Pain Managment: Goal: General experience of comfort will improve and/or be controlled Outcome: Not Progressing

## 2023-08-09 LAB — SURGICAL PATHOLOGY

## 2023-08-09 MED ORDER — METHOCARBAMOL 500 MG PO TABS
1000.0000 mg | ORAL_TABLET | Freq: Three times a day (TID) | ORAL | Status: DC
  Administered 2023-08-09 (×3): 1000 mg via ORAL
  Filled 2023-08-09 (×3): qty 2

## 2023-08-09 MED ORDER — IBUPROFEN 200 MG PO TABS: 600.0000 mg | ORAL_TABLET | Freq: Three times a day (TID) | ORAL | Status: DC | PRN

## 2023-08-09 MED ORDER — POLYETHYLENE GLYCOL 3350 17 G PO PACK
17.0000 g | PACK | Freq: Every day | ORAL | Status: DC
  Administered 2023-08-09: 17 g via ORAL
  Filled 2023-08-09: qty 1

## 2023-08-09 NOTE — Progress Notes (Signed)
  Subjective Seems to be eating ok. + flatus, but no BM yet.  Ambulating.  Unclear when JP drain quality changed.  Has hiccups  Objective: Vital signs in last 24 hours: Temp:  [98.1 F (36.7 C)-98.6 F (37 C)] 98.1 F (36.7 C) (05/28 0738) Pulse Rate:  [70-98] 70 (05/28 0738) Resp:  [16-18] 18 (05/28 0738) BP: (128-137)/(81-91) 129/83 (05/28 0738) SpO2:  [97 %-100 %] 98 % (05/28 0738) Last BM Date :  (before admission.)  Intake/Output from previous day: 05/27 0701 - 05/28 0700 In: 300 [P.O.:300] Out: 55 [Drains:55] Intake/Output this shift: No intake/output data recorded.  Gen: NAD, comfortable Pulm: Normal work of breathing Abd: Soft, not significantly distended; incision dressed and dry. Appropriate incisional soreness otherwise. JP now sanguineous and output increased some to 55cc   Lab Results: CBC  No results for input(s): "WBC", "HGB", "HCT", "PLT" in the last 72 hours.  BMET No results for input(s): "NA", "K", "CL", "CO2", "GLUCOSE", "BUN", "CREATININE", "CALCIUM" in the last 72 hours.  PT/INR No results for input(s): "LABPROT", "INR" in the last 72 hours. ABG No results for input(s): "PHART", "HCO3" in the last 72 hours.  Invalid input(s): "PCO2", "PO2"  Studies/Results:  Anti-infectives: Anti-infectives (From admission, onward)    Start     Dose/Rate Route Frequency Ordered Stop   08/04/23 1650  piperacillin-tazobactam (ZOSYN) 3.375 GM/50ML IVPB       Note to Pharmacy: Ardelia Beau, Destiny: cabinet override      08/04/23 1650 08/05/23 0459   08/04/23 1515  piperacillin-tazobactam (ZOSYN) IVPB 3.375 g  Status:  Discontinued        3.375 g 100 mL/hr over 30 Minutes Intravenous Once 08/04/23 1501 08/09/23 0752   08/04/23 1030  Ampicillin-Sulbactam (UNASYN) 3 g in sodium chloride  0.9 % 100 mL IVPB  Status:  Discontinued        3 g 200 mL/hr over 30 Minutes Intravenous  Once 08/04/23 1019 08/04/23 1021   08/04/23 1015  meropenem (MERREM) 1 g in sodium chloride   0.9 % 100 mL IVPB  Status:  Discontinued        1 g 200 mL/hr over 30 Minutes Intravenous  Once 08/04/23 1014 08/04/23 1015        Assessment/Plan: Patient Active Problem List   Diagnosis Date Noted   S/P right inguinal hernia repair 08/04/2023   POD 5, s/p Procedure(s): REPAIR, HERNIA, INGUINAL, ADULT 08/04/2023  - soft diet, add miralax  today to help with bowel regimen - multi-modal pain control - Ambulate 5x/day as able -jp drain has changed from SS yesterday to bloody/sanguinous today.  Vitals stable.  Output 55cc yesterday, about 30cc present in bulb currently. -check CBC in am -cont to monitor oral intake -if drain output stable, but remains bloody, will likely leave JP drain and if otherwise well, DC tomorrow with drain in place.    FEN - soft VTE - Lovenox ID - none currently   LOS: 5 days   Leone Ralphs, [facility]

## 2023-08-09 NOTE — Plan of Care (Signed)

## 2023-08-10 ENCOUNTER — Other Ambulatory Visit (HOSPITAL_COMMUNITY): Payer: Self-pay

## 2023-08-10 LAB — CBC
HCT: 29.7 % — ABNORMAL LOW (ref 39.0–52.0)
Hemoglobin: 10.4 g/dL — ABNORMAL LOW (ref 13.0–17.0)
MCH: 31.1 pg (ref 26.0–34.0)
MCHC: 35 g/dL (ref 30.0–36.0)
MCV: 88.9 fL (ref 80.0–100.0)
Platelets: 284 10*3/uL (ref 150–400)
RBC: 3.34 MIL/uL — ABNORMAL LOW (ref 4.22–5.81)
RDW: 13.2 % (ref 11.5–15.5)
WBC: 9.5 10*3/uL (ref 4.0–10.5)
nRBC: 0 % (ref 0.0–0.2)

## 2023-08-10 MED ORDER — METHOCARBAMOL 500 MG PO TABS
1000.0000 mg | ORAL_TABLET | Freq: Three times a day (TID) | ORAL | 0 refills | Status: AC | PRN
  Filled 2023-08-10: qty 60, 10d supply, fill #0

## 2023-08-10 MED ORDER — OXYCODONE HCL 5 MG PO TABS
5.0000 mg | ORAL_TABLET | Freq: Four times a day (QID) | ORAL | 0 refills | Status: AC | PRN
  Filled 2023-08-10: qty 25, 3d supply, fill #0

## 2023-08-10 MED ORDER — IBUPROFEN 600 MG PO TABS: 600.0000 mg | ORAL_TABLET | Freq: Three times a day (TID) | ORAL | Status: AC | PRN

## 2023-08-10 NOTE — Progress Notes (Signed)
 S/p REPAIR, HERNIA, INGUINAL 5/23   08/10/23 0750  TOC Brief Assessment  Insurance and Status Reviewed  Patient has primary care physician Yes  Home environment has been reviewed From home with mom.  Prior level of function: PTA independent with ADL's, no DMEusage. States already with RW @ home if needed @ d/c.  Prior/Current Home Services No current home services  Social Drivers of Health Review SDOH reviewed no interventions necessary  Readmission risk has been reviewed No  Transition of care needs no transition of care needs at this time   Pt states will need assist with transportation to home once d/c ready. Pt with limited funds. Doesn't work. Pt will probably d/c to lounge. Lounge can assist with transportation to home. Pt without RX med concerns. MD please send RX med to [facility] pharmacy.  Pt without PCP, MEDICAID PENDING. NCM  arranged PCP appointment and noted on AVS.  Carlee Charters RN,BSN,CM [phone]

## 2023-08-10 NOTE — Progress Notes (Signed)
 Occupational Therapy Treatment Patient Details Name: John Pearson MRN: [id number] DOB: Mar 28, 1970 Today's Date: 08/10/2023   History of present illness Pt is 53 yo male who presents on 08/04/23 for inguinal hernia repair due to increased abdominal pain and N&V x2.  PMH: multiple hernia repairs.   OT comments  Pt agreeable with OT session.  Pt demonstrates ability to complete ADLs and mobility in room using RW (as well as briefly without RW) with modified independence.  Educated on compensatory techniques for comfort of abdomen, pt voices understanding and intermittently uses recommendations as needed.  Intermittent cueing for RW mgmt safety.  Provided 5 P handout for energy conservation for activity tolerance, educated on how to incorporate techniques into daily routine.  No further acute OT needs identified and pt with no further questions or concerns.  OT will sign off.       If plan is discharge home, recommend the following:  Assistance with cooking/housework;Assist for transportation;Help with stairs or ramp for entrance   Equipment Recommendations  None recommended by OT    Recommendations for Other Services      Precautions / Restrictions Precautions Precautions: None Recall of Precautions/Restrictions: Intact Precaution/Restrictions Comments: JP drain Restrictions Weight Bearing Restrictions Per Provider Order: No       Mobility Bed Mobility Overal bed mobility: Modified Independent                  Transfers Overall transfer level: Modified independent Equipment used: None, Rolling walker (2 wheels)                     Balance Overall balance assessment: Modified Independent                                         ADL either performed or assessed with clinical judgement   ADL Overall ADL's : Modified independent                                       General ADL Comments: pt able to manage LB dressing,  transfers, toileting and grooming at EOB without assist.  Cueing at times for RW mgmt, pt reporting preference to pick up RW.  Does demonstrate ability to ambulate in room without RW    Extremity/Trunk Assessment              Vision       Perception     Praxis     Communication Communication Communication: No apparent difficulties   Cognition Arousal: Alert Behavior During Therapy: WFL for tasks assessed/performed Cognition: No apparent impairments             OT - Cognition Comments: pt tends to have his way of completing tasks, open to recommendations but does not follow through with cueing.                 Following commands: Intact        Cueing   Cueing Techniques: Verbal cues  Exercises      Shoulder Instructions       General Comments provided education on energy conservation (5 P handout) and recommendations as pt reports feeling more fatigued than normal.  Discussed how to incorporate techniques into daily routine.    Pertinent Vitals/ Pain  Pain Assessment Pain Assessment: Faces Faces Pain Scale: Hurts a little bit Pain Location: abdomen Pain Descriptors / Indicators: Operative site guarding, Sore Pain Intervention(s): Limited activity within patient's tolerance, Monitored during session, Repositioned  Home Living                                          Prior Functioning/Environment              Frequency  Min 2X/week        Progress Toward Goals  OT Goals(current goals can now be found in the care plan section)  Progress towards OT goals: Goals met/education completed, patient discharged from OT  Acute Rehab OT Goals Patient Stated Goal: get home when ready OT Goal Formulation: With patient Time For Goal Achievement: 08/20/23 Potential to Achieve Goals: Good  Plan      Co-evaluation                 AM-PAC OT "6 Clicks" Daily Activity     Outcome Measure   Help from another person  eating meals?: None Help from another person taking care of personal grooming?: None Help from another person toileting, which includes using toliet, bedpan, or urinal?: None Help from another person bathing (including washing, rinsing, drying)?: None Help from another person to put on and taking off regular upper body clothing?: None Help from another person to put on and taking off regular lower body clothing?: None 6 Click Score: 24    End of Session Equipment Utilized During Treatment: Rolling walker (2 wheels)  OT Visit Diagnosis: Unsteadiness on feet (R26.81);Muscle weakness (generalized) (M62.81);Pain Pain - part of body:  (abd)   Activity Tolerance Patient tolerated treatment well   Patient Left in chair;with call bell/phone within reach   Nurse Communication Mobility status        Time: 9147-8295 OT Time Calculation (min): 28 min  Charges: OT General Charges $OT Visit: 1 Visit OT Treatments $Self Care/Home Management : 23-37 mins  Bary Boss, OT Acute Rehabilitation Services Office [phone] Secure Chat Preferred    Fredrich Jefferson 08/10/2023, 8:55 AM

## 2023-08-10 NOTE — Plan of Care (Signed)

## 2023-08-10 NOTE — Discharge Summary (Signed)
 Patient ID: John Pearson [id number] [birth year] 53 y.o.  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  It has been previously fixed in 2008 by Dr. Brant Caldron.  This has since recurred but has not been causing problems until the last 24 hrs when he developed abdominal pain with N/V x2.  He states that the hernia became more painful.  He developed a subjective fever and chills.  He has had 2 BMs.     He presented to [facility] for further evaluation and noted to have a WBC of 15.9 with all other labs unremarkable.  He underwent a CT scan that reveals a large RIH with evidence of a SBO and thickening and fluid in the hernia concerning for ischemia.  He has been accepted in transfer to [facility] for surgical evaluation and likely intervention.  Procedures Dr. Aniceto Barley, 08/04/23 Laparotomy with Right inguinal hernia repair with JP drain placement  Hospital Course:  The patient was admitted post op.  He had an NGT post op for a post op ileus.  This resolved and he developed bowel function.  His NGT was removed and his diet advanced as tolerated.  His JP drain became a bit more sanguineous on POD 5.  Output around 50-55cc/day.  His JP was not removed prior to DC.  He was otherwise stable on POD 6 for DC home.  He has follow up arranged for staple and drain removal in our office and as well as post op follow up.  Physical Exam: Abd: soft, appropriately tender, JP slightly thinner today and sanguineous in nature.  55cc documented of output.  Incision c/d/I with staples  present.  Allergies as of 08/10/2023       Reactions   Acetaminophen  Nausea And Vomiting        Medication List     STOP taking these medications    tamsulosin  0.4 MG Caps capsule Commonly known as: FLOMAX        TAKE these medications    ibuprofen 600 MG tablet Commonly known as: ADVIL Take 1 tablet (600 mg total) by mouth 3 (three) times daily as needed for mild pain (pain score 1-3), headache or fever.   methocarbamol 500 MG tablet Commonly known as: ROBAXIN Take 2 tablets (1,000 mg total) by mouth every 8 (eight) hours as needed for muscle spasms.   multivitamin with minerals Tabs tablet Take 1 tablet by mouth daily.   naproxen sodium 220 MG tablet Commonly known as: ALEVE Take 440 mg by mouth 2 (two) times daily as needed (pain).   oxyCODONE 5 MG immediate release tablet Commonly known as: Oxy IR/ROXICODONE Take 1-2 tablets (5-10 mg total) by mouth every 6 (six) hours as needed (pain).          Follow-up Information     Anda Bamberg, MD. Go on 08/24/2023.   Specialty: Surgery Why: 10:30 AM, please arrive 15 min prior to appointment time. Contact information: [address] [facility] [place] [address] [phone]  Surgery, [place]. Go on 08/18/2023.   Specialty: General Surgery Why: 2 PM, RN visit for staple removal and JP drain removal. Please arrive 20 min prior to check in. Contact information: [address] [place] [address] [phone]         [facility] Follow up on 10/27/2023.   Why: Appointment scheduled  for 10/27/2023 at 8:30 am to establish primary care Contact information: [address] [place]  [phone]                Signed: Marlin Simmonds, [facility] Surgery 08/10/2023, 8:59 AM Please see Amion for pager number during day hours 7:00am-4:30pm, 7-11:30am on Weekends

## 2023-10-27 ENCOUNTER — Encounter (HOSPITAL_BASED_OUTPATIENT_CLINIC_OR_DEPARTMENT_OTHER): Payer: Self-pay | Admitting: Family Medicine

## 2023-10-27 ENCOUNTER — Ambulatory Visit (INDEPENDENT_AMBULATORY_CARE_PROVIDER_SITE_OTHER): Payer: Self-pay | Admitting: Family Medicine

## 2023-10-27 VITALS — BP 130/88 | HR 64 | Wt 199.2 lb

## 2023-10-27 DIAGNOSIS — R440 Auditory hallucinations: Secondary | ICD-10-CM | POA: Insufficient documentation

## 2023-10-27 DIAGNOSIS — Z8719 Personal history of other diseases of the digestive system: Secondary | ICD-10-CM

## 2023-10-27 DIAGNOSIS — R441 Visual hallucinations: Secondary | ICD-10-CM | POA: Insufficient documentation

## 2023-10-27 DIAGNOSIS — Z7689 Persons encountering health services in other specified circumstances: Secondary | ICD-10-CM

## 2023-10-27 DIAGNOSIS — Z9889 Other specified postprocedural states: Secondary | ICD-10-CM | POA: Diagnosis not present

## 2023-10-27 NOTE — Progress Notes (Signed)
 New Patient Office Visit  Subjective:   John Pearson [birth year] 10/27/2023  Chief Complaint  Patient presents with   Establish Care   Hospitalization Follow-up    Incarcerated inguinal hernia, SBO, on light duty including daily living   Headache   night terrors    Discussed the use of AI scribe software for clinical note transcription with the patient, who gave verbal consent to proceed.  History of Present Illness John Pearson is a 53 year old male who presents to establish care s/p right inguinal incarcerated hernia repair with headaches and night terrors.  S/P Inguinal Hernia:  He had an incarcerated inguinal hernia repair in May and completed a six-week follow-up with surgery at the end of July. He experiences sensitivity when lifting heavy objects, which he describes as 'remotely heavy', and feels some 'tissue baggage', attributing it to body fluid drainage.  He denies abdominal pain, difficulty with bowel movements, constipation or diarrhea.  Reports surgical site has healed well.  MENTAL HEALTH CONCERNS:  He experiences headaches, which he attributes to work and stress. He describes episodes where his head spins when lying down and occasionally hears voices, such as someone screaming beside his head. These auditory experiences occur mostly at night when he is trying to sleep. He is aware that the voices are not real and questions their reality, but experiences them as if they are happening.  He has a family history of mental health issues, noting that his father experienced PTSD from his [place] experience and often experienced hallucinations. He describes hearing specific words like 'phone' shouted at him and having conversations with voices as if they know him. He also reports visual experiences that others do not see, which he tries to keep to himself.He has not served in [employer] and acknowledges that PTSD can result from childhood trauma. He has experienced  these symptoms since his teenage years. He is currently not on any medication for these symptoms and has not had a primary care doctor before.   The following portions of the patient's history were reviewed and updated as appropriate: past medical history, past surgical history, family history, social history, allergies, medications, and problem list.   Patient Active Problem List   Diagnosis Date Noted   Auditory hallucinations 10/27/2023   Hallucinations, visual 10/27/2023   S/P right inguinal hernia repair 08/04/2023   History reviewed. No pertinent past medical history. Past Surgical History:  Procedure Laterality Date   HERNIA REPAIR     INGUINAL HERNIA REPAIR Right 08/04/2023   Procedure: REPAIR, HERNIA, INGUINAL, ADULT;  Surgeon: Paola Dreama SAILOR, MD;  Location: [facility] OR;  Service: General;  Laterality: Right;   Family History  Problem Relation Age of Onset   Healthy Mother    Social History   Socioeconomic History   Marital status: Single    Spouse name: Not on file   Number of children: 0   Years of education: 12   Highest education level: High school graduate  Occupational History   Not on file  Tobacco Use   Smoking status: Every Day    Types: Cigarettes   Smokeless tobacco: Never  Vaping Use   Vaping status: Never Used  Substance and Sexual Activity   Alcohol use: No   Drug use: No   Sexual activity: Yes  Other Topics Concern   Not on file  Social History Narrative   Not on file   Social Drivers of Health   Financial Resource Strain: Not  on file  Food Insecurity: No Food Insecurity (08/04/2023)   Hunger Vital Sign    Worried About Running Out of Food in the Last Year: Never true    Ran Out of Food in the Last Year: Never true  Transportation Needs: No Transportation Needs (08/04/2023)   PRAPARE - Administrator, Civil Service (Medical): No    Lack of Transportation (Non-Medical): No  Physical Activity: Not on file  Stress: Not on file   Social Connections: Not on file  Intimate Partner Violence: Not At Risk (08/04/2023)   Humiliation, Afraid, Rape, and Kick questionnaire    Fear of Current or Ex-Partner: No    Emotionally Abused: No    Physically Abused: No    Sexually Abused: No   Outpatient Medications Prior to Visit  Medication Sig Dispense Refill   ibuprofen  (ADVIL ) 600 MG tablet Take 1 tablet (600 mg total) by mouth 3 (three) times daily as needed for mild pain (pain score 1-3), headache or fever.     methocarbamol  (ROBAXIN ) 500 MG tablet Take 2 tablets (1,000 mg total) by mouth every 8 (eight) hours as needed for muscle spasms. 60 tablet 0   Multiple Vitamin (MULTIVITAMIN WITH MINERALS) TABS tablet Take 1 tablet by mouth daily.     naproxen sodium (ALEVE) 220 MG tablet Take 440 mg by mouth 2 (two) times daily as needed (pain).     oxyCODONE  (OXY IR/ROXICODONE ) 5 MG immediate release tablet Take 1-2 tablets (5-10 mg total) by mouth every 6 (six) hours as needed (pain). 25 tablet 0   No facility-administered medications prior to visit.   Allergies  Allergen Reactions   Acetaminophen  Nausea And Vomiting    ROS: A complete ROS was performed with pertinent positives/negatives noted in the HPI. The remainder of the ROS are negative.   Objective:   Today's Vitals   10/27/23 0822  BP: 130/88  Pulse: 64  SpO2: 99%  Weight: 199 lb 3.2 oz (90.4 kg)    GENERAL: Well-appearing, in NAD. Well nourished.  SKIN: Pink, warm and dry.  Head: Normocephalic. NECK: Trachea midline. Full ROM w/o pain or tenderness. RESPIRATORY: Chest wall symmetrical. Respirations even and non-labored. Breath sounds clear to auscultation bilaterally.  CARDIAC: S1, S2 present, regular rate and rhythm without murmur or gallops. Peripheral pulses 2+ bilaterally.  GI: Abdomen soft, non-tender. Normoactive bowel sounds. No rebound tenderness. No hepatomegaly or splenomegaly. No CVA tenderness. Well healed surgical incision site present to  umbilicus and right inguinal area without signs of infection, drainage, or dehiscence.  MSK: Muscle tone and strength appropriate for age. Joints w/o tenderness, redness, or swelling.  EXTREMITIES: Without clubbing, cyanosis, or edema.  NEUROLOGIC: No motor or sensory deficits. Steady, even gait. C2-C12 intact.  PSYCH/MENTAL STATUS: Alert, oriented x 3. Cooperative, appropriate mood and affect. Hygiene and clothing are unremarkable. Judgement is intact. Thought process is coherent.    Health Maintenance Due  Topic Date Due   HIV Screening  Never done   Hepatitis C Screening  Never done   DTaP/Tdap/Td (1 - Tdap) Never done   Pneumococcal Vaccine: 50+ Years (1 of 2 - PCV) Never done   Hepatitis B Vaccines 19-59 Average Risk (1 of 3 - 19+ 3-dose series) Never done   Colonoscopy  Never done   Zoster Vaccines- Shingrix (1 of 2) Never done   COVID-19 Vaccine (1 - 2024-25 season) Never done   INFLUENZA VACCINE  10/13/2023    No results found for any visits on 10/27/23.  Assessment & Plan:  1. Encounter to establish care with new doctor (Primary) Discussed role of PCP and recommend he return within 2 to 3 months for annual exam and fasting lab work.  Patient verbalized understanding and will schedule appointment.  2. Auditory hallucinations 3. Hallucinations, visual Discussed concern of auditory and visual hallucinations at length with patient and recommended further workup and treatment with trial of medication.  Several medications discussed with patient and he would prefer to see psychiatry prior to starting any treatment for further evaluation.  PCP agreeable and referral placed to psychiatry.  Patient understanding to reach out to PCP if he does not hear from psychiatry within 1 week.  Safety plan reviewed with patient and he verbalized understanding.  - Ambulatory referral to Psychiatry  4. S/P right inguinal hernia repair Resolved and surgical incision site has healed well.   Discussed monitoring heavy lifting and avoiding exacerbation.  No signs of acute bleeding, urinary issues, or issue with bowel movements.  Patient to reach out to office if new, worrisome, or unresolved symptoms arise or if no improvement in patient's condition. Patient verbalized understanding and is agreeable to treatment plan. All questions answered to patient's satisfaction.    Return in about 3 months (around 01/27/2024) for ANNUAL PHYSICAL (fasting labs at visit) .    Thersia Schuyler Stark, [place]

## 2023-10-27 NOTE — Patient Instructions (Signed)
 If you do not hear from Psychiatry by next week, please call our office or send a MyChart message.

## 2024-02-01 ENCOUNTER — Encounter (HOSPITAL_BASED_OUTPATIENT_CLINIC_OR_DEPARTMENT_OTHER): Payer: Self-pay | Admitting: Family Medicine

## 2024-02-01 ENCOUNTER — Ambulatory Visit (INDEPENDENT_AMBULATORY_CARE_PROVIDER_SITE_OTHER): Admitting: Family Medicine

## 2024-02-01 VITALS — BP 130/88 | HR 87 | Ht 70.0 in | Wt 212.5 lb

## 2024-02-01 DIAGNOSIS — Z0000 Encounter for general adult medical examination without abnormal findings: Secondary | ICD-10-CM | POA: Diagnosis not present

## 2024-02-01 DIAGNOSIS — Z720 Tobacco use: Secondary | ICD-10-CM

## 2024-02-01 DIAGNOSIS — Z125 Encounter for screening for malignant neoplasm of prostate: Secondary | ICD-10-CM

## 2024-02-01 DIAGNOSIS — Z1211 Encounter for screening for malignant neoplasm of colon: Secondary | ICD-10-CM

## 2024-02-01 DIAGNOSIS — Z13220 Encounter for screening for lipoid disorders: Secondary | ICD-10-CM | POA: Diagnosis not present

## 2024-02-01 DIAGNOSIS — Z23 Encounter for immunization: Secondary | ICD-10-CM

## 2024-02-01 DIAGNOSIS — R1031 Right lower quadrant pain: Secondary | ICD-10-CM | POA: Diagnosis not present

## 2024-02-01 NOTE — Progress Notes (Signed)
 Subjective:   John Pearson 1970/05/30 02/01/2024  CC: Chief Complaint  Patient presents with   Annual Exam    Pt is here today for his physical. Has concerns about the groin on right side.    HPI: John Pearson is a 53 y.o. male who presents for a routine health maintenance exam.  Labs collected at time of visit.   HEALTH SCREENINGS: - Vision Screening: Recommended - Dental Visits: Recommended - Testicular Exam: Declined - STD Screening: Declined - PSA (50+): Ordered today  No results found for: PSA1, PSA   - Colonoscopy (45+): Ordered today  Discussed with patient purpose of the colonoscopy is to detect colon cancer at curable precancerous or early stages  - AAA Screening: Not applicable  Men age 29-75 who have ever smoked - Lung Cancer screening with low-dose CT: Recommended, declined -  Adults age 28-80 who are current cigarette smokers or quit within the last 15 years. Must have 20 pack year history.   Depression and Anxiety Screen done today and results listed below:     02/01/2024    8:55 AM  Depression screen PHQ 2/9  Decreased Interest 0  Down, Depressed, Hopeless 0  PHQ - 2 Score 0  Altered sleeping 1  Tired, decreased energy 1  Change in appetite 0  Feeling bad or failure about yourself  0  Trouble concentrating 1  Moving slowly or fidgety/restless 0  Suicidal thoughts 0  PHQ-9 Score 3  Difficult doing work/chores Not difficult at all      02/01/2024    8:57 AM  GAD 7 : Generalized Anxiety Score  Nervous, Anxious, on Edge 3  Control/stop worrying 1  Worry too much - different things 0  Trouble relaxing 0  Restless 1  Easily annoyed or irritable 0  Afraid - awful might happen 0  Total GAD 7 Score 5  Anxiety Difficulty Not difficult at all    IMMUNIZATIONS:  - Tdap: Tetanus vaccination status reviewed: Declined. - Influenza: Administered today - Pneumovax: Not applicable - Prevnar: Not applicable - Shingrix vaccine (50+):  Recommended   Past medical history, surgical history, medications, allergies, family history and social history reviewed with patient today and changes made to appropriate areas of the chart.   History reviewed. No pertinent past medical history.  Past Surgical History:  Procedure Laterality Date   HERNIA REPAIR     INGUINAL HERNIA REPAIR Right 08/04/2023   Procedure: REPAIR, HERNIA, INGUINAL, ADULT;  Surgeon: Paola Dreama SAILOR, MD;  Location: [facility] OR;  Service: General;  Laterality: Right;    Current Outpatient Medications on File Prior to Visit  Medication Sig   ibuprofen  (ADVIL ) 600 MG tablet Take 1 tablet (600 mg total) by mouth 3 (three) times daily as needed for mild pain (pain score 1-3), headache or fever.   methocarbamol  (ROBAXIN ) 500 MG tablet Take 2 tablets (1,000 mg total) by mouth every 8 (eight) hours as needed for muscle spasms.   Multiple Vitamin (MULTIVITAMIN WITH MINERALS) TABS tablet Take 1 tablet by mouth daily.   naproxen sodium (ALEVE) 220 MG tablet Take 440 mg by mouth 2 (two) times daily as needed (pain).   oxyCODONE  (OXY IR/ROXICODONE ) 5 MG immediate release tablet Take 1-2 tablets (5-10 mg total) by mouth every 6 (six) hours as needed (pain).   No current facility-administered medications on file prior to visit.    Allergies  Allergen Reactions   Acetaminophen  Nausea And Vomiting     Social History  Socioeconomic History   Marital status: Single    Spouse name: Not on file   Number of children: 0   Years of education: 31   Highest education level: High school graduate  Occupational History   Not on file  Tobacco Use   Smoking status: Every Day    Current packs/day: 0.20    Average packs/day: 0.2 packs/day for 33.9 years (6.8 ttl pk-yrs)    Types: Cigarettes    Start date: 03/14/1990   Smokeless tobacco: Never  Vaping Use   Vaping status: Never Used  Substance and Sexual Activity   Alcohol use: No   Drug use: No   Sexual activity: Yes  Other  Topics Concern   Not on file  Social History Narrative   Not on file   Social Drivers of Health   Financial Resource Strain: Not on file  Food Insecurity: No Food Insecurity (08/04/2023)   Hunger Vital Sign    Worried About Running Out of Food in the Last Year: Never true    Ran Out of Food in the Last Year: Never true  Transportation Needs: No Transportation Needs (08/04/2023)   PRAPARE - Administrator, Civil Service (Medical): No    Lack of Transportation (Non-Medical): No  Physical Activity: Not on file  Stress: Not on file  Social Connections: Not on file  Intimate Partner Violence: Not At Risk (08/04/2023)   Humiliation, Afraid, Rape, and Kick questionnaire    Fear of Current or Ex-Partner: No    Emotionally Abused: No    Physically Abused: No    Sexually Abused: No   Social History   Tobacco Use  Smoking Status Every Day   Current packs/day: 0.20   Average packs/day: 0.2 packs/day for 33.9 years (6.8 ttl pk-yrs)   Types: Cigarettes   Start date: 03/14/1990  Smokeless Tobacco Never   Social History   Substance and Sexual Activity  Alcohol Use No     Family History  Problem Relation Age of Onset   Healthy Mother      ROS: Denies fever, fatigue, unexplained weight loss/gain, CP, SHOB, and palpatitations. Denies neurological deficits, gastrointestinal and/or genitourinary complaints, and skin changes.   Objective:   Today's Vitals   02/01/24 0852 02/01/24 0918  BP: (!) 137/100 130/88  Pulse: 87   SpO2: 97%   Weight: 212 lb 8 oz (96.4 kg)   Height: 5' 10 (1.778 m)     GENERAL APPEARANCE: Well-appearing, in NAD. Well nourished.  SKIN: Pink, warm and dry. Turgor normal. No rash, lesion, ulceration, or ecchymoses. Hair evenly distributed.  HEENT: HEAD: Normocephalic.  EYES: PERRLA. EOMI. Lids intact w/o defect. Sclera white, Conjunctiva pink w/o exudate.  EARS: External ear w/o redness, swelling, masses or lesions. EAC clear. TM's intact,  translucent w/o bulging, appropriate landmarks visualized. Appropriate acuity to conversational tones.  NOSE: Septum midline w/o deformity. Nares patent, mucosa pink and non-inflamed w/o drainage. No sinus tenderness.  THROAT: Uvula midline. Oropharynx clear. Tonsils non-inflamed w/o exudate. Oral mucosa pink and moist.  NECK: Supple, Trachea midline. Full ROM w/o pain or tenderness. No lymphadenopathy. Thyroid non-tender w/o enlargement or palpable masses.  RESPIRATORY: Chest wall symmetrical w/o masses. Respirations even and non-labored. Breath sounds clear to auscultation bilaterally. No wheezes, rales, rhonchi, or crackles. CARDIAC: S1, S2 present, regular rate and rhythm. No gallops, murmurs, rubs, or clicks. PMI w/o lifts, heaves, or thrills. No carotid bruits. Capillary refill <2 seconds. Peripheral pulses 2+ bilaterally. GI: Abdomen soft w/o distention.  Normoactive bowel sounds. No palpable masses or tenderness. No guarding or rebound tenderness. Liver and spleen w/o tenderness or enlargement. No CVA tenderness. Right inguinal mild swelling without palpable mass or tenderness.  GU: Pt deferred exam. MSK: Muscle tone and strength appropriate for age, w/o atrophy or abnormal movement. EXTREMITIES: Active ROM intact, w/o tenderness, crepitus, or contracture. No obvious joint deformities or effusions. No clubbing, edema, or cyanosis.  NEUROLOGIC: CN's II-XII intact. Motor strength symmetrical with no obvious weakness. No sensory deficits. DTR 2+ symmetric bilaterally. Steady, even gait.  PSYCH/MENTAL STATUS: Alert, oriented x 3. Cooperative, appropriate mood and affect.     Assessment & Plan:  1. Annual physical exam (Primary) Discussed preventative screenings, vaccines, and healthy lifestyle with patient.   - Lipid panel - PSA - TSH - Comprehensive metabolic panel with GFR - CBC with Differential/Platelet - Hemoglobin A1c  2. Screening for lipid disorders - Lipid panel  3. Prostate  cancer screening - PSA  4. Tobacco user Patient declines Lung cancer screening. We discussed the risk of this means means there could be underlying issue or malignancy not identified, increased morbidity, and even mortality. Patient verbalized understanding and acceptance of risk. Patient knows they can change their mind at any time and we will be happy to coordinate these things for them.   5. Screening for colon cancer Cscope referral placed.  - Ambulatory referral to Gastroenterology  6. Immunization due - Flu vaccine trivalent PF, 6mos and older(Flulaval,Afluria,Fluarix,Fluzone)  7. Right groin pain S/p right inguinal incarcerated hernia repair. Patient reports ongoing fluid accumulations and chronic pain to right inguinal area. Will obtain US  of the right groin to evaluate.   - US  Pelvis Limited; Future   Orders Placed This Encounter  Procedures   US  Pelvis Limited    Standing Status:   Future    Expiration Date:   01/31/2025    Reason for Exam (SYMPTOM  OR DIAGNOSIS REQUIRED):   Right inguinal pain with fluid accumulation and edema s/p inguinal hernia repair    Preferred imaging location?:   [facility]   Flu vaccine trivalent PF, 6mos and older(Flulaval,Afluria,Fluarix,Fluzone)   Lipid panel   PSA   TSH   Comprehensive metabolic panel with GFR   CBC with Differential/Platelet   Hemoglobin A1c   Ambulatory referral to Gastroenterology    Referral Priority:   Routine    Referral Type:   Consultation    Referral Reason:   Specialty Services Required    Number of Visits Requested:   1    PATIENT COUNSELING: - Encouraged to adjust caloric intake to maintain or achieve ideal body weight, to reduce intake of dietary saturated fat and total fat, to limit sodium intake by avoiding high sodium foods and not adding table salt, and to maintain adequate dietary potassium and calcium preferably from fresh fruits, vegetables, and low-fat dairy products.   - Advised to avoid  cigarette smoking. - Discussed with the patient that most people either abstain from alcohol or drink within safe limits (<=14/week and <=4 drinks/occasion for males, <=7/weeks and <= 3 drinks/occasion for females) and that the risk for alcohol disorders and other health effects rises proportionally with the number of drinks per week and how often a drinker exceeds daily limits. - Discussed cessation/primary prevention of drug use and availability of treatment for abuse.   - Stressed the importance of regular exercise - Injury prevention: Discussed safety belts, safety helmets, smoke detector, smoking near bedding or upholstery.  - Dental health: Discussed  importance of regular tooth brushing, flossing, and dental visits.  - Sexuality: Discussed sexually transmitted diseases, partner selection, use of condoms, avoidance of unintended pregnancy  and contraceptive alternatives.   NEXT PREVENTATIVE PHYSICAL DUE IN 1 YEAR.  Return in about 1 year (around 01/31/2025) for ANNUAL PHYSICAL.  Patient to reach out to office if new, worrisome, or unresolved symptoms arise or if no improvement in patient's condition. Patient verbalized understanding and is agreeable to treatment plan. All questions answered to patient's satisfaction.    Thersia Schuyler Stark, [place]

## 2024-02-01 NOTE — Patient Instructions (Signed)
 [email or web address]  [facility] Locations: [facility] [address] [place], [place] [address] [phone]   [facility] [address] [place], [place] [address] [phone]  [facility] [address] [place], [place] [address] [phone]  [facility] [address] [place], [place] [address] [phone]  [facility] [address], [place], [place] [address] [phone]    [facility] Location: [facility] [address], [address], [place], [place] [address] Details: This 45-chair [facility] is open to all patients; no pre-registration is required. Doors open at 6:00 a.m.

## 2024-02-02 LAB — LIPID PANEL
Chol/HDL Ratio: 5.4 ratio — ABNORMAL HIGH (ref 0.0–5.0)
Cholesterol, Total: 179 mg/dL (ref 100–199)
HDL: 33 mg/dL — ABNORMAL LOW (ref >39)
LDL Chol Calc (NIH): 129 mg/dL — ABNORMAL HIGH (ref 0–99)
Triglycerides: 90 mg/dL (ref 0–149)
VLDL Cholesterol Cal: 17 mg/dL (ref 5–40)

## 2024-02-02 LAB — CBC WITH DIFFERENTIAL/PLATELET
Basophils Absolute: 0.1 x10E3/uL (ref 0.0–0.2)
Basos: 1 %
EOS (ABSOLUTE): 0.2 x10E3/uL (ref 0.0–0.4)
Eos: 2 %
Hematocrit: 50.8 % (ref 37.5–51.0)
Hemoglobin: 16.9 g/dL (ref 13.0–17.7)
Immature Grans (Abs): 0 x10E3/uL (ref 0.0–0.1)
Immature Granulocytes: 0 %
Lymphocytes Absolute: 3.3 x10E3/uL — ABNORMAL HIGH (ref 0.7–3.1)
Lymphs: 40 %
MCH: 31.1 pg (ref 26.6–33.0)
MCHC: 33.3 g/dL (ref 31.5–35.7)
MCV: 93 fL (ref 79–97)
Monocytes Absolute: 0.7 x10E3/uL (ref 0.1–0.9)
Monocytes: 8 %
Neutrophils Absolute: 4 x10E3/uL (ref 1.4–7.0)
Neutrophils: 49 %
Platelets: 271 x10E3/uL (ref 150–450)
RBC: 5.44 x10E6/uL (ref 4.14–5.80)
RDW: 14 % (ref 11.6–15.4)
WBC: 8.3 x10E3/uL (ref 3.4–10.8)

## 2024-02-02 LAB — COMPREHENSIVE METABOLIC PANEL WITH GFR
ALT: 19 IU/L (ref 0–44)
AST: 18 IU/L (ref 0–40)
Albumin: 4.4 g/dL (ref 3.8–4.9)
Alkaline Phosphatase: 79 IU/L (ref 47–123)
BUN/Creatinine Ratio: 9 (ref 9–20)
BUN: 9 mg/dL (ref 6–24)
Bilirubin Total: 0.4 mg/dL (ref 0.0–1.2)
CO2: 24 mmol/L (ref 20–29)
Calcium: 9.3 mg/dL (ref 8.7–10.2)
Chloride: 103 mmol/L (ref 96–106)
Creatinine, Ser: 0.99 mg/dL (ref 0.76–1.27)
Globulin, Total: 2.5 g/dL (ref 1.5–4.5)
Glucose: 96 mg/dL (ref 70–99)
Potassium: 4.4 mmol/L (ref 3.5–5.2)
Sodium: 139 mmol/L (ref 134–144)
Total Protein: 6.9 g/dL (ref 6.0–8.5)
eGFR: 92 mL/min/1.73 (ref >59)

## 2024-02-02 LAB — HEMOGLOBIN A1C
Est. average glucose Bld gHb Est-mCnc: 108 mg/dL
Hgb A1c MFr Bld: 5.4 % (ref 4.8–5.6)

## 2024-02-02 LAB — PSA: Prostate Specific Ag, Serum: 0.6 ng/mL (ref 0.0–4.0)

## 2024-02-02 LAB — TSH: TSH: 1.84 u[IU]/mL (ref 0.450–4.500)

## 2024-02-05 ENCOUNTER — Ambulatory Visit (HOSPITAL_BASED_OUTPATIENT_CLINIC_OR_DEPARTMENT_OTHER): Payer: Self-pay | Admitting: Family Medicine

## 2024-02-05 DIAGNOSIS — E785 Hyperlipidemia, unspecified: Secondary | ICD-10-CM | POA: Insufficient documentation

## 2024-02-05 NOTE — Progress Notes (Signed)
 Hi John Pearson,  Your cholesterol is elevated and given this elevation with a low HDL (good cholesterol) I do recommend starting cholesterol medication such as Lipitor.   Your lymphocyte count was slightly elevated. Given that you are asymptomatic, I would like to repeat this with a recheck of your cholesterol. If you are agreeable to starting cholesterol medication, please let me know and I will schedule a lab repeat in 8 weeks.

## 2024-02-13 ENCOUNTER — Ambulatory Visit (HOSPITAL_BASED_OUTPATIENT_CLINIC_OR_DEPARTMENT_OTHER)
Admission: RE | Admit: 2024-02-13 | Discharge: 2024-02-13 | Disposition: A | Source: Ambulatory Visit | Attending: Family Medicine | Admitting: Family Medicine

## 2024-02-13 ENCOUNTER — Other Ambulatory Visit (HOSPITAL_BASED_OUTPATIENT_CLINIC_OR_DEPARTMENT_OTHER): Payer: Self-pay | Admitting: Family Medicine

## 2024-02-13 DIAGNOSIS — K4091 Unilateral inguinal hernia, without obstruction or gangrene, recurrent: Secondary | ICD-10-CM | POA: Diagnosis not present

## 2024-02-13 DIAGNOSIS — Z0000 Encounter for general adult medical examination without abnormal findings: Secondary | ICD-10-CM

## 2024-02-13 DIAGNOSIS — Z125 Encounter for screening for malignant neoplasm of prostate: Secondary | ICD-10-CM

## 2024-02-13 DIAGNOSIS — R1031 Right lower quadrant pain: Secondary | ICD-10-CM | POA: Insufficient documentation

## 2024-02-13 DIAGNOSIS — Z1211 Encounter for screening for malignant neoplasm of colon: Secondary | ICD-10-CM

## 2024-02-13 DIAGNOSIS — R600 Localized edema: Secondary | ICD-10-CM | POA: Diagnosis not present

## 2024-02-13 DIAGNOSIS — K4090 Unilateral inguinal hernia, without obstruction or gangrene, not specified as recurrent: Secondary | ICD-10-CM | POA: Diagnosis not present

## 2024-02-13 DIAGNOSIS — Z720 Tobacco use: Secondary | ICD-10-CM

## 2024-02-13 DIAGNOSIS — Z23 Encounter for immunization: Secondary | ICD-10-CM

## 2024-02-13 DIAGNOSIS — Z13220 Encounter for screening for lipoid disorders: Secondary | ICD-10-CM

## 2024-02-21 ENCOUNTER — Other Ambulatory Visit (HOSPITAL_BASED_OUTPATIENT_CLINIC_OR_DEPARTMENT_OTHER): Payer: Self-pay | Admitting: Family Medicine

## 2024-02-21 ENCOUNTER — Ambulatory Visit (HOSPITAL_BASED_OUTPATIENT_CLINIC_OR_DEPARTMENT_OTHER): Payer: Self-pay | Admitting: Family Medicine

## 2024-02-21 DIAGNOSIS — Z8719 Personal history of other diseases of the digestive system: Secondary | ICD-10-CM

## 2024-02-21 DIAGNOSIS — K4090 Unilateral inguinal hernia, without obstruction or gangrene, not specified as recurrent: Secondary | ICD-10-CM

## 2024-02-21 NOTE — Progress Notes (Signed)
 Discussed results of ultrasound with patient with findings of recurrent right inguinal hernia.  Patient is aware and agreeable to referral to [facility] surgery.  We discussed red flag signs and symptoms to seek the ER for and he verbalized understanding.  If he does not hear from surgeon within the next 48 hours, he will reach out to schedule.

## 2024-02-29 DIAGNOSIS — K4091 Unilateral inguinal hernia, without obstruction or gangrene, recurrent: Secondary | ICD-10-CM | POA: Diagnosis not present

## 2024-04-15 ENCOUNTER — Encounter: Payer: Self-pay | Admitting: Gastroenterology

## 2025-02-03 ENCOUNTER — Encounter (HOSPITAL_BASED_OUTPATIENT_CLINIC_OR_DEPARTMENT_OTHER): Admitting: Family Medicine
